# Patient Record
Sex: Male | Born: 1935 | Race: White | Hispanic: No | Marital: Married
Health system: Southern US, Community
[De-identification: ages and names within clinical notes are randomized; demographics above are authoritative.]

---

## 2003-11-30 ENCOUNTER — Inpatient Hospital Stay: Payer: Self-pay | Admitting: Internal Medicine

## 2003-11-30 ENCOUNTER — Other Ambulatory Visit: Payer: Self-pay

## 2005-02-13 ENCOUNTER — Inpatient Hospital Stay: Payer: Self-pay

## 2005-02-13 ENCOUNTER — Other Ambulatory Visit: Payer: Self-pay

## 2005-02-21 ENCOUNTER — Encounter: Payer: Self-pay | Admitting: Internal Medicine

## 2005-02-27 ENCOUNTER — Ambulatory Visit: Payer: Self-pay | Admitting: Internal Medicine

## 2005-03-09 ENCOUNTER — Encounter: Payer: Self-pay | Admitting: Internal Medicine

## 2005-03-13 ENCOUNTER — Ambulatory Visit: Payer: Self-pay | Admitting: Internal Medicine

## 2005-03-20 ENCOUNTER — Encounter: Payer: Self-pay | Admitting: Internal Medicine

## 2005-03-29 ENCOUNTER — Other Ambulatory Visit: Payer: Self-pay

## 2005-03-30 ENCOUNTER — Inpatient Hospital Stay: Payer: Self-pay | Admitting: Internal Medicine

## 2005-04-02 ENCOUNTER — Other Ambulatory Visit: Payer: Self-pay

## 2005-04-05 ENCOUNTER — Encounter: Payer: Self-pay | Admitting: Internal Medicine

## 2005-04-06 ENCOUNTER — Encounter: Payer: Self-pay | Admitting: Internal Medicine

## 2005-04-07 ENCOUNTER — Ambulatory Visit: Payer: Self-pay | Admitting: Internal Medicine

## 2005-05-08 ENCOUNTER — Ambulatory Visit: Payer: Self-pay | Admitting: Internal Medicine

## 2005-05-19 ENCOUNTER — Other Ambulatory Visit: Payer: Self-pay

## 2005-05-26 ENCOUNTER — Inpatient Hospital Stay: Payer: Self-pay | Admitting: Surgery

## 2005-06-06 ENCOUNTER — Ambulatory Visit: Payer: Self-pay | Admitting: Internal Medicine

## 2005-07-07 ENCOUNTER — Ambulatory Visit: Payer: Self-pay | Admitting: Internal Medicine

## 2005-08-06 ENCOUNTER — Ambulatory Visit: Payer: Self-pay | Admitting: Internal Medicine

## 2005-09-06 ENCOUNTER — Ambulatory Visit: Payer: Self-pay | Admitting: Internal Medicine

## 2005-10-11 ENCOUNTER — Ambulatory Visit: Payer: Self-pay | Admitting: Internal Medicine

## 2005-11-06 ENCOUNTER — Ambulatory Visit: Payer: Self-pay | Admitting: Internal Medicine

## 2005-12-07 ENCOUNTER — Ambulatory Visit: Payer: Self-pay | Admitting: Internal Medicine

## 2006-02-16 ENCOUNTER — Ambulatory Visit: Payer: Self-pay | Admitting: Internal Medicine

## 2006-03-09 ENCOUNTER — Ambulatory Visit: Payer: Self-pay | Admitting: Internal Medicine

## 2006-04-07 ENCOUNTER — Ambulatory Visit: Payer: Self-pay | Admitting: Internal Medicine

## 2006-04-17 ENCOUNTER — Encounter: Payer: Self-pay | Admitting: Family Medicine

## 2006-05-11 ENCOUNTER — Ambulatory Visit: Payer: Self-pay | Admitting: Internal Medicine

## 2006-05-15 ENCOUNTER — Encounter: Payer: Self-pay | Admitting: Family Medicine

## 2006-06-07 ENCOUNTER — Ambulatory Visit: Payer: Self-pay | Admitting: Internal Medicine

## 2006-06-07 ENCOUNTER — Encounter: Payer: Self-pay | Admitting: Family Medicine

## 2006-07-08 ENCOUNTER — Ambulatory Visit: Payer: Self-pay | Admitting: Internal Medicine

## 2006-08-07 ENCOUNTER — Ambulatory Visit: Payer: Self-pay | Admitting: Internal Medicine

## 2006-09-14 ENCOUNTER — Ambulatory Visit: Payer: Self-pay | Admitting: Internal Medicine

## 2006-10-08 ENCOUNTER — Ambulatory Visit: Payer: Self-pay | Admitting: Internal Medicine

## 2006-11-07 ENCOUNTER — Ambulatory Visit: Payer: Self-pay | Admitting: Internal Medicine

## 2006-12-08 ENCOUNTER — Ambulatory Visit: Payer: Self-pay | Admitting: Internal Medicine

## 2007-01-07 ENCOUNTER — Ambulatory Visit: Payer: Self-pay | Admitting: Internal Medicine

## 2007-02-01 ENCOUNTER — Ambulatory Visit: Payer: Self-pay | Admitting: Internal Medicine

## 2007-02-07 ENCOUNTER — Ambulatory Visit: Payer: Self-pay | Admitting: Internal Medicine

## 2007-03-10 ENCOUNTER — Ambulatory Visit: Payer: Self-pay | Admitting: Internal Medicine

## 2007-04-07 ENCOUNTER — Ambulatory Visit: Payer: Self-pay | Admitting: Internal Medicine

## 2007-04-09 ENCOUNTER — Ambulatory Visit: Payer: Self-pay | Admitting: Surgery

## 2007-05-08 ENCOUNTER — Ambulatory Visit: Payer: Self-pay | Admitting: Internal Medicine

## 2007-05-24 ENCOUNTER — Ambulatory Visit: Payer: Self-pay | Admitting: Internal Medicine

## 2007-06-07 ENCOUNTER — Ambulatory Visit: Payer: Self-pay | Admitting: Internal Medicine

## 2007-07-19 ENCOUNTER — Ambulatory Visit: Payer: Self-pay | Admitting: Internal Medicine

## 2007-08-07 ENCOUNTER — Ambulatory Visit: Payer: Self-pay | Admitting: Internal Medicine

## 2007-09-13 ENCOUNTER — Ambulatory Visit: Payer: Self-pay | Admitting: Internal Medicine

## 2007-10-08 ENCOUNTER — Ambulatory Visit: Payer: Self-pay | Admitting: Internal Medicine

## 2007-11-08 ENCOUNTER — Ambulatory Visit: Payer: Self-pay | Admitting: Internal Medicine

## 2007-12-08 ENCOUNTER — Ambulatory Visit: Payer: Self-pay | Admitting: Internal Medicine

## 2008-01-07 ENCOUNTER — Ambulatory Visit: Payer: Self-pay | Admitting: Internal Medicine

## 2008-02-07 ENCOUNTER — Ambulatory Visit: Payer: Self-pay | Admitting: Internal Medicine

## 2008-02-28 ENCOUNTER — Ambulatory Visit: Payer: Self-pay | Admitting: Internal Medicine

## 2008-03-09 ENCOUNTER — Ambulatory Visit: Payer: Self-pay | Admitting: Internal Medicine

## 2008-04-06 ENCOUNTER — Ambulatory Visit: Payer: Self-pay | Admitting: Internal Medicine

## 2008-04-24 ENCOUNTER — Ambulatory Visit: Payer: Self-pay | Admitting: Internal Medicine

## 2008-05-07 ENCOUNTER — Ambulatory Visit: Payer: Self-pay | Admitting: Internal Medicine

## 2008-06-19 ENCOUNTER — Ambulatory Visit: Payer: Self-pay | Admitting: Surgery

## 2008-08-14 ENCOUNTER — Ambulatory Visit: Payer: Self-pay | Admitting: Internal Medicine

## 2008-09-06 ENCOUNTER — Ambulatory Visit: Payer: Self-pay | Admitting: Internal Medicine

## 2008-10-09 ENCOUNTER — Ambulatory Visit: Payer: Self-pay | Admitting: Internal Medicine

## 2008-11-06 ENCOUNTER — Ambulatory Visit: Payer: Self-pay | Admitting: Internal Medicine

## 2008-12-07 ENCOUNTER — Ambulatory Visit: Payer: Self-pay | Admitting: Internal Medicine

## 2009-01-22 ENCOUNTER — Ambulatory Visit: Payer: Self-pay | Admitting: Internal Medicine

## 2009-02-06 ENCOUNTER — Ambulatory Visit: Payer: Self-pay | Admitting: Internal Medicine

## 2009-03-26 ENCOUNTER — Ambulatory Visit: Payer: Self-pay | Admitting: Internal Medicine

## 2009-04-06 ENCOUNTER — Ambulatory Visit: Payer: Self-pay | Admitting: Internal Medicine

## 2009-05-21 ENCOUNTER — Ambulatory Visit: Payer: Self-pay | Admitting: Internal Medicine

## 2009-06-06 ENCOUNTER — Ambulatory Visit: Payer: Self-pay | Admitting: Internal Medicine

## 2009-07-07 ENCOUNTER — Ambulatory Visit: Payer: Self-pay | Admitting: Internal Medicine

## 2009-07-16 ENCOUNTER — Ambulatory Visit: Payer: Self-pay | Admitting: Internal Medicine

## 2009-08-06 ENCOUNTER — Ambulatory Visit: Payer: Self-pay | Admitting: Internal Medicine

## 2009-08-20 ENCOUNTER — Ambulatory Visit: Payer: Self-pay | Admitting: Surgery

## 2009-09-10 ENCOUNTER — Ambulatory Visit: Payer: Self-pay | Admitting: Internal Medicine

## 2009-10-07 ENCOUNTER — Ambulatory Visit: Payer: Self-pay | Admitting: Internal Medicine

## 2009-11-06 ENCOUNTER — Ambulatory Visit: Payer: Self-pay | Admitting: Internal Medicine

## 2009-12-31 ENCOUNTER — Ambulatory Visit: Payer: Self-pay | Admitting: Internal Medicine

## 2010-01-06 ENCOUNTER — Ambulatory Visit: Payer: Self-pay | Admitting: Internal Medicine

## 2010-02-25 ENCOUNTER — Ambulatory Visit: Payer: Self-pay | Admitting: Internal Medicine

## 2010-03-09 ENCOUNTER — Ambulatory Visit: Payer: Self-pay | Admitting: Internal Medicine

## 2010-04-22 ENCOUNTER — Ambulatory Visit: Payer: Self-pay | Admitting: Internal Medicine

## 2010-05-08 ENCOUNTER — Ambulatory Visit: Payer: Self-pay | Admitting: Internal Medicine

## 2010-06-17 ENCOUNTER — Ambulatory Visit: Payer: Self-pay | Admitting: Internal Medicine

## 2010-07-08 ENCOUNTER — Ambulatory Visit: Payer: Self-pay | Admitting: Internal Medicine

## 2010-08-12 ENCOUNTER — Ambulatory Visit: Payer: Self-pay | Admitting: Internal Medicine

## 2010-09-07 ENCOUNTER — Ambulatory Visit: Payer: Self-pay | Admitting: Internal Medicine

## 2010-10-12 ENCOUNTER — Ambulatory Visit: Payer: Self-pay | Admitting: Internal Medicine

## 2010-11-07 ENCOUNTER — Ambulatory Visit: Payer: Self-pay | Admitting: Internal Medicine

## 2010-12-08 ENCOUNTER — Ambulatory Visit: Payer: Self-pay | Admitting: Internal Medicine

## 2011-01-07 ENCOUNTER — Ambulatory Visit: Payer: Self-pay | Admitting: Internal Medicine

## 2011-02-07 ENCOUNTER — Ambulatory Visit: Payer: Self-pay | Admitting: Internal Medicine

## 2011-03-03 LAB — CBC CANCER CENTER
Basophil #: 0.1 x10 3/mm (ref 0.0–0.1)
Basophil %: 1.4 %
Eosinophil #: 0.1 x10 3/mm (ref 0.0–0.7)
Eosinophil %: 3.4 %
HCT: 36.6 % — ABNORMAL LOW (ref 40.0–52.0)
Lymphocyte #: 0.6 x10 3/mm — ABNORMAL LOW (ref 1.0–3.6)
Lymphocyte %: 17.1 %
MCH: 45.6 pg — ABNORMAL HIGH (ref 26.0–34.0)
MCHC: 35 g/dL (ref 32.0–36.0)
MCV: 130 fL — ABNORMAL HIGH (ref 80–100)
Neutrophil #: 2.6 x10 3/mm (ref 1.4–6.5)
RDW: 15.4 % — ABNORMAL HIGH (ref 11.5–14.5)
WBC: 3.8 x10 3/mm (ref 3.8–10.6)

## 2011-03-03 LAB — CREATININE, SERUM: EGFR (Non-African Amer.): >60

## 2011-03-03 LAB — HEPATIC FUNCTION PANEL A (ARMC)
Albumin: 3.9 g/dL (ref 3.4–5.0)
Bilirubin, Direct: 0.1 mg/dL (ref 0.00–0.20)
Total Protein: 6.9 g/dL (ref 6.4–8.2)

## 2011-03-10 ENCOUNTER — Ambulatory Visit: Payer: Self-pay | Admitting: Internal Medicine

## 2011-03-31 LAB — CBC CANCER CENTER
Basophil %: 1.8 %
Eosinophil #: 0.1 x10 3/mm (ref 0.0–0.7)
HCT: 38.4 % — ABNORMAL LOW (ref 40.0–52.0)
HGB: 13.5 g/dL (ref 13.0–18.0)
Lymphocyte %: 19.3 %
MCH: 45.6 pg — ABNORMAL HIGH (ref 26.0–34.0)
Monocyte %: 10.4 %
Neutrophil %: 65.2 %
Platelet: 274 x10 3/mm (ref 150–440)
RBC: 2.95 10*6/uL — ABNORMAL LOW (ref 4.40–5.90)
WBC: 4 x10 3/mm (ref 3.8–10.6)

## 2011-03-31 LAB — HEPATIC FUNCTION PANEL A (ARMC)
Albumin: 4.1 g/dL (ref 3.4–5.0)
Alkaline Phosphatase: 58 U/L (ref 50–136)
SGOT(AST): 17 U/L (ref 15–37)

## 2011-03-31 LAB — CREATININE, SERUM
Creatinine: 1.17 mg/dL (ref 0.60–1.30)
EGFR (African American): >60
EGFR (Non-African Amer.): >60

## 2011-04-07 ENCOUNTER — Ambulatory Visit: Payer: Self-pay | Admitting: Internal Medicine

## 2011-05-12 ENCOUNTER — Ambulatory Visit: Payer: Self-pay | Admitting: Internal Medicine

## 2011-05-12 LAB — CBC CANCER CENTER
Basophil #: 0 x10 3/mm (ref 0.0–0.1)
Eosinophil #: 0.1 x10 3/mm (ref 0.0–0.7)
Eosinophil %: 2.1 %
HCT: 39 % — ABNORMAL LOW (ref 40.0–52.0)
HGB: 13.7 g/dL (ref 13.0–18.0)
MCHC: 35.2 g/dL (ref 32.0–36.0)
MCV: 129 fL — ABNORMAL HIGH (ref 80–100)
Monocyte %: 10.3 %
Neutrophil #: 2.7 x10 3/mm (ref 1.4–6.5)
Neutrophil %: 68.8 %
Platelet: 265 x10 3/mm (ref 150–440)
WBC: 3.9 x10 3/mm (ref 3.8–10.6)

## 2011-06-07 ENCOUNTER — Ambulatory Visit: Payer: Self-pay | Admitting: Internal Medicine

## 2011-06-23 LAB — CBC CANCER CENTER
Basophil #: 0 x10 3/mm (ref 0.0–0.1)
Basophil %: 1.4 %
Eosinophil #: 0.1 x10 3/mm (ref 0.0–0.7)
HCT: 36.9 % — ABNORMAL LOW (ref 40.0–52.0)
HGB: 12.6 g/dL — ABNORMAL LOW (ref 13.0–18.0)
Lymphocyte #: 0.6 x10 3/mm — ABNORMAL LOW (ref 1.0–3.6)
MCH: 45.7 pg — ABNORMAL HIGH (ref 26.0–34.0)
MCHC: 34.2 g/dL (ref 32.0–36.0)
Monocyte #: 0.3 x10 3/mm (ref 0.2–1.0)
Monocyte %: 8.7 %
Neutrophil %: 70.3 %
Platelet: 209 x10 3/mm (ref 150–440)
RBC: 2.76 10*6/uL — ABNORMAL LOW (ref 4.40–5.90)
RDW: 14.2 % (ref 11.5–14.5)

## 2011-06-23 LAB — CREATININE, SERUM
Creatinine: 1.24 mg/dL (ref 0.60–1.30)
EGFR (African American): >60
EGFR (Non-African Amer.): 57 — ABNORMAL LOW

## 2011-06-23 LAB — HEPATIC FUNCTION PANEL A (ARMC)
Bilirubin,Total: 0.7 mg/dL (ref 0.2–1.0)
SGOT(AST): 16 U/L (ref 15–37)
Total Protein: 7 g/dL (ref 6.4–8.2)

## 2011-07-08 ENCOUNTER — Ambulatory Visit: Payer: Self-pay | Admitting: Internal Medicine

## 2011-08-04 LAB — CBC CANCER CENTER
Basophil %: 1.7 %
Eosinophil #: 0.1 x10 3/mm (ref 0.0–0.7)
Eosinophil %: 1.9 %
HCT: 38.1 % — ABNORMAL LOW (ref 40.0–52.0)
Lymphocyte %: 12.4 %
MCHC: 34.3 g/dL (ref 32.0–36.0)
Monocyte %: 8.6 %
Neutrophil #: 3.3 x10 3/mm (ref 1.4–6.5)
Platelet: 318 x10 3/mm (ref 150–440)
WBC: 4.4 x10 3/mm (ref 3.8–10.6)

## 2011-08-07 ENCOUNTER — Ambulatory Visit: Payer: Self-pay | Admitting: Internal Medicine

## 2011-08-18 ENCOUNTER — Ambulatory Visit: Payer: Self-pay | Admitting: Ophthalmology

## 2011-08-18 LAB — CBC WITH DIFFERENTIAL/PLATELET
Basophil #: 0.1 10*3/uL (ref 0.0–0.1)
Basophil %: 1.1 %
Eosinophil #: 0.1 10*3/uL (ref 0.0–0.7)
Eosinophil %: 1.3 %
HCT: 42.3 % (ref 40.0–52.0)
HGB: 14.4 g/dL (ref 13.0–18.0)
Lymphocyte %: 12.5 %
MCHC: 34.1 g/dL (ref 32.0–36.0)
Monocyte #: 0.6 x10 3/mm (ref 0.2–1.0)
Monocyte %: 9.1 %
Neutrophil %: 76 %
RBC: 3.3 10*6/uL — ABNORMAL LOW (ref 4.40–5.90)
WBC: 6.6 10*3/uL (ref 3.8–10.6)

## 2011-08-28 ENCOUNTER — Ambulatory Visit: Payer: Self-pay | Admitting: Ophthalmology

## 2011-09-15 ENCOUNTER — Ambulatory Visit: Payer: Self-pay | Admitting: Internal Medicine

## 2011-09-15 LAB — CBC CANCER CENTER
Basophil %: 1.2 %
Eosinophil %: 1.2 %
HGB: 13.5 g/dL (ref 13.0–18.0)
Lymphocyte %: 7.9 %
MCH: 43.4 pg — ABNORMAL HIGH (ref 26.0–34.0)
Monocyte #: 0.8 x10 3/mm (ref 0.2–1.0)
Monocyte %: 12.6 %
Neutrophil %: 77.1 %
Platelet: 350 x10 3/mm (ref 150–440)
RBC: 3.12 10*6/uL — ABNORMAL LOW (ref 4.40–5.90)
WBC: 6.2 x10 3/mm (ref 3.8–10.6)

## 2011-09-15 LAB — CREATININE, SERUM
EGFR (African American): >60
EGFR (Non-African Amer.): 55 — ABNORMAL LOW

## 2011-09-15 LAB — HEPATIC FUNCTION PANEL A (ARMC)
Albumin: 3.5 g/dL (ref 3.4–5.0)
Alkaline Phosphatase: 66 U/L (ref 50–136)
SGOT(AST): 13 U/L — ABNORMAL LOW (ref 15–37)
SGPT (ALT): 14 U/L (ref 12–78)

## 2011-09-17 LAB — CBC
HCT: 39.1 % — ABNORMAL LOW (ref 40.0–52.0)
MCH: 43.1 pg — ABNORMAL HIGH (ref 26.0–34.0)
MCV: 123 fL — ABNORMAL HIGH (ref 80–100)
Platelet: 423 10*3/uL (ref 150–440)
RBC: 3.19 10*6/uL — ABNORMAL LOW (ref 4.40–5.90)
WBC: 9.9 10*3/uL (ref 3.8–10.6)

## 2011-09-17 LAB — BASIC METABOLIC PANEL
Anion Gap: 7 (ref 7–16)
Calcium, Total: 9 mg/dL (ref 8.5–10.1)
Chloride: 108 mmol/L — ABNORMAL HIGH (ref 98–107)
Creatinine: 1.4 mg/dL — ABNORMAL HIGH (ref 0.60–1.30)
EGFR (African American): 57 — ABNORMAL LOW
EGFR (Non-African Amer.): 49 — ABNORMAL LOW
Glucose: 116 mg/dL — ABNORMAL HIGH (ref 65–99)
Potassium: 4.7 mmol/L (ref 3.5–5.1)
Sodium: 142 mmol/L (ref 136–145)

## 2011-09-17 LAB — CK TOTAL AND CKMB (NOT AT ARMC)
CK, Total: 44 U/L (ref 35–232)
CK-MB: <0.5 ng/mL — ABNORMAL LOW (ref 0.5–3.6)

## 2011-09-17 LAB — TROPONIN I: Troponin-I: <0.02 ng/mL

## 2011-09-18 ENCOUNTER — Inpatient Hospital Stay: Payer: Self-pay | Admitting: Internal Medicine

## 2011-09-18 LAB — URINALYSIS, COMPLETE
Bilirubin,UR: NEGATIVE
Blood: NEGATIVE
Glucose,UR: NEGATIVE mg/dL (ref 0–75)
Leukocyte Esterase: NEGATIVE
Nitrite: NEGATIVE
Ph: 5 (ref 4.5–8.0)
Protein: NEGATIVE
RBC,UR: <1 /HPF (ref 0–5)
Specific Gravity: 1.015 (ref 1.003–1.030)
Squamous Epithelial: NONE SEEN

## 2011-09-18 LAB — APTT
Activated PTT: 34.5 secs (ref 23.6–35.9)
Activated PTT: 64.1 secs — ABNORMAL HIGH (ref 23.6–35.9)
Activated PTT: 82 secs — ABNORMAL HIGH (ref 23.6–35.9)

## 2011-09-19 LAB — CBC WITH DIFFERENTIAL/PLATELET
Basophil %: 1.1 %
Eosinophil %: 2.8 %
HGB: 11.9 g/dL — ABNORMAL LOW (ref 13.0–18.0)
Lymphocyte #: 0.7 10*3/uL — ABNORMAL LOW (ref 1.0–3.6)
Lymphocyte %: 9.7 %
MCH: 42.2 pg — ABNORMAL HIGH (ref 26.0–34.0)
Monocyte #: 0.9 x10 3/mm (ref 0.2–1.0)
Neutrophil %: 74.1 %
Platelet: 393 10*3/uL (ref 150–440)
RBC: 2.83 10*6/uL — ABNORMAL LOW (ref 4.40–5.90)
RDW: 12.6 % (ref 11.5–14.5)
WBC: 7.5 10*3/uL (ref 3.8–10.6)

## 2011-09-19 LAB — BASIC METABOLIC PANEL
Calcium, Total: 8 mg/dL — ABNORMAL LOW (ref 8.5–10.1)
Chloride: 109 mmol/L — ABNORMAL HIGH (ref 98–107)
Co2: 25 mmol/L (ref 21–32)
Creatinine: 0.97 mg/dL (ref 0.60–1.30)
EGFR (African American): >60
Osmolality: 282 (ref 275–301)
Potassium: 3.8 mmol/L (ref 3.5–5.1)
Sodium: 142 mmol/L (ref 136–145)

## 2011-09-20 LAB — PROTIME-INR
INR: 1.1
Prothrombin Time: 14.4 secs (ref 11.5–14.7)

## 2011-09-22 LAB — APTT
Activated PTT: 134.2 secs — ABNORMAL HIGH (ref 23.6–35.9)
Activated PTT: 87.9 secs — ABNORMAL HIGH (ref 23.6–35.9)

## 2011-09-22 LAB — PROTIME-INR
INR: 1.9
Prothrombin Time: 21.7 secs — ABNORMAL HIGH (ref 11.5–14.7)

## 2011-09-23 LAB — APTT: Activated PTT: 126.6 secs — ABNORMAL HIGH (ref 23.6–35.9)

## 2011-09-23 LAB — PROTIME-INR
INR: 1.7
Prothrombin Time: 20.6 secs — ABNORMAL HIGH (ref 11.5–14.7)

## 2011-09-24 LAB — PLATELET COUNT: Platelet: 516 10*3/uL — ABNORMAL HIGH (ref 150–440)

## 2011-09-24 LAB — HEMOGLOBIN: HGB: 11.6 g/dL — ABNORMAL LOW (ref 13.0–18.0)

## 2011-09-25 ENCOUNTER — Ambulatory Visit: Payer: Self-pay | Admitting: Internal Medicine

## 2011-10-04 ENCOUNTER — Inpatient Hospital Stay: Payer: Self-pay | Admitting: Specialist

## 2011-10-04 LAB — CBC WITH DIFFERENTIAL/PLATELET
Basophil %: 0.8 %
Eosinophil #: 0.1 10*3/uL (ref 0.0–0.7)
Eosinophil %: 0.7 %
HCT: 36.8 % — ABNORMAL LOW (ref 40.0–52.0)
MCH: 39.5 pg — ABNORMAL HIGH (ref 26.0–34.0)
MCHC: 33.6 g/dL (ref 32.0–36.0)
Monocyte #: 1 x10 3/mm (ref 0.2–1.0)
Neutrophil %: 83.8 %
Platelet: 387 10*3/uL (ref 150–440)
RBC: 3.13 10*6/uL — ABNORMAL LOW (ref 4.40–5.90)

## 2011-10-04 LAB — COMPREHENSIVE METABOLIC PANEL
Albumin: 3.4 g/dL (ref 3.4–5.0)
Alkaline Phosphatase: 58 U/L (ref 50–136)
Anion Gap: 8 (ref 7–16)
Bilirubin,Total: 0.4 mg/dL (ref 0.2–1.0)
Co2: 25 mmol/L (ref 21–32)
Creatinine: 1.17 mg/dL (ref 0.60–1.30)
EGFR (Non-African Amer.): >60
Glucose: 99 mg/dL (ref 65–99)
Osmolality: 279 (ref 275–301)
Potassium: 3.8 mmol/L (ref 3.5–5.1)
Sodium: 140 mmol/L (ref 136–145)
Total Protein: 6.7 g/dL (ref 6.4–8.2)

## 2011-10-04 LAB — URINALYSIS, COMPLETE
Blood: NEGATIVE
Glucose,UR: NEGATIVE mg/dL (ref 0–75)
Hyaline Cast: 3
Leukocyte Esterase: NEGATIVE
Nitrite: NEGATIVE
Ph: 6 (ref 4.5–8.0)
Protein: NEGATIVE
Specific Gravity: 1.015 (ref 1.003–1.030)
WBC UR: <1 /HPF (ref 0–5)

## 2011-10-04 LAB — APTT: Activated PTT: 33.1 secs (ref 23.6–35.9)

## 2011-10-04 LAB — TROPONIN I: Troponin-I: <0.02 ng/mL

## 2011-10-04 LAB — PROTIME-INR: INR: 1.5

## 2011-10-05 ENCOUNTER — Ambulatory Visit: Payer: Self-pay | Admitting: Neurology

## 2011-10-05 DIAGNOSIS — I059 Rheumatic mitral valve disease, unspecified: Secondary | ICD-10-CM

## 2011-10-05 LAB — CBC WITH DIFFERENTIAL/PLATELET
Eosinophil %: 2.5 %
HCT: 34.3 % — ABNORMAL LOW (ref 40.0–52.0)
HGB: 11.5 g/dL — ABNORMAL LOW (ref 13.0–18.0)
Lymphocyte %: 12.3 %
MCH: 39.2 pg — ABNORMAL HIGH (ref 26.0–34.0)
Monocyte %: 9.8 %
Neutrophil %: 73.1 %
Platelet: 340 10*3/uL (ref 150–440)
RBC: 2.92 10*6/uL — ABNORMAL LOW (ref 4.40–5.90)
RDW: 13.5 % (ref 11.5–14.5)
WBC: 6.3 10*3/uL (ref 3.8–10.6)

## 2011-10-05 LAB — LIPID PANEL
Cholesterol: 120 mg/dL (ref 0–200)
Ldl Cholesterol, Calc: 77 mg/dL (ref 0–100)
Triglycerides: 76 mg/dL (ref 0–200)

## 2011-10-05 LAB — BASIC METABOLIC PANEL
Calcium, Total: 8.5 mg/dL (ref 8.5–10.1)
Co2: 23 mmol/L (ref 21–32)
EGFR (African American): >60
Glucose: 98 mg/dL (ref 65–99)
Osmolality: 282 (ref 275–301)
Potassium: 4.1 mmol/L (ref 3.5–5.1)
Sodium: 142 mmol/L (ref 136–145)

## 2011-10-06 ENCOUNTER — Ambulatory Visit: Payer: Self-pay | Admitting: Neurology

## 2011-10-06 LAB — PROTIME-INR
INR: 2.5
Prothrombin Time: 27.5 secs — ABNORMAL HIGH (ref 11.5–14.7)

## 2011-10-07 LAB — PROTIME-INR
INR: 2.2
Prothrombin Time: 24.9 secs — ABNORMAL HIGH (ref 11.5–14.7)

## 2011-10-08 ENCOUNTER — Ambulatory Visit: Payer: Self-pay | Admitting: Internal Medicine

## 2011-10-09 LAB — PROTIME-INR: Prothrombin Time: 25.7 secs — ABNORMAL HIGH (ref 11.5–14.7)

## 2011-10-10 LAB — HEMOGLOBIN: HGB: 11.7 g/dL — ABNORMAL LOW (ref 13.0–18.0)

## 2011-10-10 LAB — PROTIME-INR: INR: 2.4

## 2011-10-27 ENCOUNTER — Ambulatory Visit: Payer: Self-pay | Admitting: Internal Medicine

## 2011-10-27 LAB — CBC CANCER CENTER
Basophil #: 0.1 x10 3/mm (ref 0.0–0.1)
Eosinophil #: 0.1 x10 3/mm (ref 0.0–0.7)
Eosinophil %: 1.1 %
HCT: 38.9 % — ABNORMAL LOW (ref 40.0–52.0)
HGB: 12.8 g/dL — ABNORMAL LOW (ref 13.0–18.0)
Lymphocyte #: 0.5 x10 3/mm — ABNORMAL LOW (ref 1.0–3.6)
Lymphocyte %: 6.1 %
MCHC: 32.8 g/dL (ref 32.0–36.0)
Monocyte %: 9.6 %
Neutrophil #: 6.4 x10 3/mm (ref 1.4–6.5)
Neutrophil %: 81.8 %
RDW: 14.3 % (ref 11.5–14.5)
WBC: 7.8 x10 3/mm (ref 3.8–10.6)

## 2011-11-03 ENCOUNTER — Ambulatory Visit: Payer: Self-pay | Admitting: Vascular Surgery

## 2011-11-07 ENCOUNTER — Ambulatory Visit: Payer: Self-pay | Admitting: Internal Medicine

## 2011-12-08 ENCOUNTER — Ambulatory Visit: Payer: Self-pay | Admitting: Internal Medicine

## 2011-12-08 LAB — HEPATIC FUNCTION PANEL A (ARMC)
Albumin: 3.4 g/dL (ref 3.4–5.0)
Bilirubin, Direct: 0.1 mg/dL (ref 0.00–0.20)
SGOT(AST): 18 U/L (ref 15–37)
SGPT (ALT): 26 U/L (ref 12–78)
Total Protein: 6.7 g/dL (ref 6.4–8.2)

## 2011-12-08 LAB — CBC CANCER CENTER
Basophil #: 0.2 x10 3/mm — ABNORMAL HIGH (ref 0.0–0.1)
Eosinophil %: 2.9 %
HGB: 10.8 g/dL — ABNORMAL LOW (ref 13.0–18.0)
Lymphocyte #: 0.7 x10 3/mm — ABNORMAL LOW (ref 1.0–3.6)
Lymphocyte %: 10 %
MCH: 31.2 pg (ref 26.0–34.0)
MCHC: 30.8 g/dL — ABNORMAL LOW (ref 32.0–36.0)
MCV: 101 fL — ABNORMAL HIGH (ref 80–100)
Monocyte #: 0.5 x10 3/mm (ref 0.2–1.0)
Neutrophil #: 5.1 x10 3/mm (ref 1.4–6.5)
Neutrophil %: 77.2 %
Platelet: 455 x10 3/mm — ABNORMAL HIGH (ref 150–440)
RDW: 15.9 % — ABNORMAL HIGH (ref 11.5–14.5)

## 2011-12-08 LAB — PROTIME-INR
INR: 2
Prothrombin Time: 23.3 secs — ABNORMAL HIGH (ref 11.5–14.7)

## 2011-12-08 LAB — CREATININE, SERUM: EGFR (African American): >60

## 2012-01-07 ENCOUNTER — Ambulatory Visit: Payer: Self-pay | Admitting: Internal Medicine

## 2012-01-19 LAB — CBC CANCER CENTER
Basophil %: 2.2 %
Eosinophil #: 0.3 x10 3/mm (ref 0.0–0.7)
Eosinophil %: 3.1 %
HCT: 38.4 % — ABNORMAL LOW (ref 40.0–52.0)
HGB: 12.3 g/dL — ABNORMAL LOW (ref 13.0–18.0)
Lymphocyte #: 0.7 x10 3/mm — ABNORMAL LOW (ref 1.0–3.6)
MCH: 29 pg (ref 26.0–34.0)
MCHC: 32.1 g/dL (ref 32.0–36.0)
MCV: 90 fL (ref 80–100)
Monocyte #: 0.8 x10 3/mm (ref 0.2–1.0)
Neutrophil #: 6.7 x10 3/mm — ABNORMAL HIGH (ref 1.4–6.5)
Neutrophil %: 77.3 %
RBC: 4.25 10*6/uL — ABNORMAL LOW (ref 4.40–5.90)
WBC: 8.7 x10 3/mm (ref 3.8–10.6)

## 2012-02-07 ENCOUNTER — Ambulatory Visit: Payer: Self-pay | Admitting: Internal Medicine

## 2012-03-01 LAB — HEPATIC FUNCTION PANEL A (ARMC)
Alkaline Phosphatase: 60 U/L (ref 50–136)
Bilirubin, Direct: 0.1 mg/dL (ref 0.00–0.20)
Bilirubin,Total: 0.4 mg/dL (ref 0.2–1.0)
SGOT(AST): 16 U/L (ref 15–37)
SGPT (ALT): 15 U/L (ref 12–78)
Total Protein: 6.9 g/dL (ref 6.4–8.2)

## 2012-03-01 LAB — CBC CANCER CENTER
Basophil #: 0.1 x10 3/mm (ref 0.0–0.1)
Basophil %: 1.4 %
Eosinophil %: 2.1 %
MCH: 26.8 pg (ref 26.0–34.0)
MCHC: 31.3 g/dL — ABNORMAL LOW (ref 32.0–36.0)
MCV: 86 fL (ref 80–100)
Monocyte #: 0.6 x10 3/mm (ref 0.2–1.0)
Monocyte %: 7 %
Neutrophil #: 7.5 x10 3/mm — ABNORMAL HIGH (ref 1.4–6.5)
RBC: 4.54 10*6/uL (ref 4.40–5.90)
RDW: 16.4 % — ABNORMAL HIGH (ref 11.5–14.5)
WBC: 9.1 x10 3/mm (ref 3.8–10.6)

## 2012-03-01 LAB — CREATININE, SERUM
Creatinine: 1.1 mg/dL (ref 0.60–1.30)
EGFR (African American): >60
EGFR (Non-African Amer.): >60

## 2012-03-01 LAB — FERRITIN: Ferritin (ARMC): 9 ng/mL (ref 8–388)

## 2012-03-09 ENCOUNTER — Ambulatory Visit: Payer: Self-pay | Admitting: Internal Medicine

## 2012-04-12 ENCOUNTER — Ambulatory Visit: Payer: Self-pay | Admitting: Internal Medicine

## 2012-04-19 LAB — CBC CANCER CENTER
Basophil %: 2.3 %
Eosinophil %: 1.6 %
HGB: 12.3 g/dL — ABNORMAL LOW (ref 13.0–18.0)
Lymphocyte %: 11.9 %
MCH: 31.3 pg (ref 26.0–34.0)
MCHC: 32.5 g/dL (ref 32.0–36.0)
MCV: 96 fL (ref 80–100)
Monocyte #: 0.3 x10 3/mm (ref 0.2–1.0)
Monocyte %: 7.2 %
Neutrophil #: 2.8 x10 3/mm (ref 1.4–6.5)
Neutrophil %: 77 %
Platelet: 316 x10 3/mm (ref 150–440)
WBC: 3.6 x10 3/mm — ABNORMAL LOW (ref 3.8–10.6)

## 2012-05-07 ENCOUNTER — Ambulatory Visit: Payer: Self-pay | Admitting: Internal Medicine

## 2012-05-27 LAB — HEPATIC FUNCTION PANEL A (ARMC)
Albumin: 3.5 g/dL (ref 3.4–5.0)
SGOT(AST): 14 U/L — ABNORMAL LOW (ref 15–37)
Total Protein: 6.6 g/dL (ref 6.4–8.2)

## 2012-05-27 LAB — CREATININE, SERUM
Creatinine: 1.17 mg/dL (ref 0.60–1.30)
EGFR (African American): >60
EGFR (Non-African Amer.): >60

## 2012-05-27 LAB — CBC CANCER CENTER
Basophil #: 0 x10 3/mm (ref 0.0–0.1)
Basophil %: 1.3 %
Eosinophil %: 0.9 %
HGB: 12.6 g/dL — ABNORMAL LOW (ref 13.0–18.0)
Lymphocyte %: 13.7 %
Monocyte #: 0.5 x10 3/mm (ref 0.2–1.0)
Neutrophil #: 2.6 x10 3/mm (ref 1.4–6.5)
Neutrophil %: 70.3 %
Platelet: 268 x10 3/mm (ref 150–440)
RDW: 31.5 % — ABNORMAL HIGH (ref 11.5–14.5)

## 2012-06-06 ENCOUNTER — Ambulatory Visit: Payer: Self-pay | Admitting: Internal Medicine

## 2012-07-05 LAB — CBC CANCER CENTER
Eosinophil #: 0 x10 3/mm (ref 0.0–0.7)
HCT: 37.2 % — ABNORMAL LOW (ref 40.0–52.0)
HGB: 13.1 g/dL (ref 13.0–18.0)
Lymphocyte %: 10.5 %
MCH: 42.9 pg — ABNORMAL HIGH (ref 26.0–34.0)
MCHC: 35.1 g/dL (ref 32.0–36.0)
MCV: 122 fL — ABNORMAL HIGH (ref 80–100)
Monocyte #: 0.5 x10 3/mm (ref 0.2–1.0)
Monocyte %: 11.6 %
Platelet: 285 x10 3/mm (ref 150–440)
RBC: 3.04 10*6/uL — ABNORMAL LOW (ref 4.40–5.90)
RDW: 19.5 % — ABNORMAL HIGH (ref 11.5–14.5)
WBC: 4.4 x10 3/mm (ref 3.8–10.6)

## 2012-07-07 ENCOUNTER — Ambulatory Visit: Payer: Self-pay | Admitting: Internal Medicine

## 2012-08-06 ENCOUNTER — Ambulatory Visit: Payer: Self-pay | Admitting: Internal Medicine

## 2012-08-16 LAB — CREATININE, SERUM
EGFR (African American): 58 — ABNORMAL LOW
EGFR (Non-African Amer.): 50 — ABNORMAL LOW

## 2012-08-16 LAB — CBC CANCER CENTER
Basophil %: 0.9 %
Eosinophil #: 0 x10 3/mm (ref 0.0–0.7)
Eosinophil %: 0.5 %
HCT: 34.8 % — ABNORMAL LOW (ref 40.0–52.0)
HGB: 12.7 g/dL — ABNORMAL LOW (ref 13.0–18.0)
Lymphocyte %: 10.9 %
MCH: 48.3 pg — ABNORMAL HIGH (ref 26.0–34.0)
MCV: 132 fL — ABNORMAL HIGH (ref 80–100)
Neutrophil #: 3.2 x10 3/mm (ref 1.4–6.5)
Neutrophil %: 78.9 %
RBC: 2.63 10*6/uL — ABNORMAL LOW (ref 4.40–5.90)
RDW: 13.6 % (ref 11.5–14.5)

## 2012-08-16 LAB — HEPATIC FUNCTION PANEL A (ARMC)
Bilirubin,Total: 0.5 mg/dL (ref 0.2–1.0)
SGPT (ALT): 15 U/L (ref 12–78)
Total Protein: 6.6 g/dL (ref 6.4–8.2)

## 2012-08-16 LAB — FERRITIN: Ferritin (ARMC): 60 ng/mL (ref 8–388)

## 2012-09-06 ENCOUNTER — Ambulatory Visit: Payer: Self-pay | Admitting: Internal Medicine

## 2012-09-27 LAB — CBC CANCER CENTER
Basophil %: 1.2 %
Eosinophil #: 0 x10 3/mm (ref 0.0–0.7)
Eosinophil %: 0.7 %
Lymphocyte #: 0.5 x10 3/mm — ABNORMAL LOW (ref 1.0–3.6)
MCHC: 36.6 g/dL — ABNORMAL HIGH (ref 32.0–36.0)
Monocyte #: 0.4 x10 3/mm (ref 0.2–1.0)
Neutrophil #: 2.9 x10 3/mm (ref 1.4–6.5)
Neutrophil %: 74.9 %
Platelet: 265 x10 3/mm (ref 150–440)
RBC: 2.58 10*6/uL — ABNORMAL LOW (ref 4.40–5.90)
RDW: 14.6 % — ABNORMAL HIGH (ref 11.5–14.5)
WBC: 3.9 x10 3/mm (ref 3.8–10.6)

## 2012-10-07 ENCOUNTER — Ambulatory Visit: Payer: Self-pay | Admitting: Internal Medicine

## 2012-11-08 ENCOUNTER — Ambulatory Visit: Payer: Self-pay | Admitting: Internal Medicine

## 2012-11-08 LAB — CBC CANCER CENTER
Eosinophil #: 0 x10 3/mm (ref 0.0–0.7)
HGB: 12.6 g/dL — ABNORMAL LOW (ref 13.0–18.0)
MCH: 49.4 pg — ABNORMAL HIGH (ref 26.0–34.0)
MCV: 138 fL — ABNORMAL HIGH (ref 80–100)
Monocyte #: 0.4 x10 3/mm (ref 0.2–1.0)
Monocyte %: 11.3 %
Neutrophil #: 2.5 x10 3/mm (ref 1.4–6.5)
Neutrophil %: 75.2 %
RDW: 13.5 % (ref 11.5–14.5)
WBC: 3.3 x10 3/mm — ABNORMAL LOW (ref 3.8–10.6)

## 2012-11-08 LAB — HEPATIC FUNCTION PANEL A (ARMC)
Albumin: 3.8 g/dL (ref 3.4–5.0)
Alkaline Phosphatase: 55 U/L (ref 50–136)
SGOT(AST): 13 U/L — ABNORMAL LOW (ref 15–37)
Total Protein: 6.7 g/dL (ref 6.4–8.2)

## 2012-12-07 ENCOUNTER — Ambulatory Visit: Payer: Self-pay | Admitting: Internal Medicine

## 2012-12-20 LAB — CBC CANCER CENTER
Basophil #: 0.1 x10 3/mm (ref 0.0–0.1)
Basophil %: 1.4 %
Eosinophil #: 0 x10 3/mm (ref 0.0–0.7)
Eosinophil %: 0.7 %
HCT: 40.2 % (ref 40.0–52.0)
HGB: 14 g/dL (ref 13.0–18.0)
Lymphocyte %: 10.5 %
MCHC: 34.8 g/dL (ref 32.0–36.0)
MCV: 136 fL — ABNORMAL HIGH (ref 80–100)
Monocyte %: 9.9 %
Neutrophil #: 4.1 x10 3/mm (ref 1.4–6.5)
RBC: 2.96 10*6/uL — ABNORMAL LOW (ref 4.40–5.90)
RDW: 12.2 % (ref 11.5–14.5)

## 2013-01-06 ENCOUNTER — Ambulatory Visit: Payer: Self-pay | Admitting: Internal Medicine

## 2013-01-24 LAB — CBC CANCER CENTER
Basophil #: 0 x10 3/mm (ref 0.0–0.1)
Basophil %: 0.8 %
Eosinophil %: 0.9 %
HGB: 14 g/dL (ref 13.0–18.0)
MCV: 129 fL — ABNORMAL HIGH (ref 80–100)
Neutrophil #: 3.9 x10 3/mm (ref 1.4–6.5)
Neutrophil %: 78.3 %
RBC: 3.19 10*6/uL — ABNORMAL LOW (ref 4.40–5.90)
RDW: 12 % (ref 11.5–14.5)
WBC: 4.9 x10 3/mm (ref 3.8–10.6)

## 2013-01-24 LAB — FERRITIN: Ferritin (ARMC): 15 ng/mL (ref 8–388)

## 2013-01-24 LAB — HEPATIC FUNCTION PANEL A (ARMC)
Albumin: 3.7 g/dL (ref 3.4–5.0)
Alkaline Phosphatase: 47 U/L
Bilirubin, Direct: 0.1 mg/dL (ref 0.00–0.20)
Bilirubin,Total: 0.3 mg/dL (ref 0.2–1.0)
Total Protein: 6.9 g/dL (ref 6.4–8.2)

## 2013-02-06 ENCOUNTER — Ambulatory Visit: Payer: Self-pay | Admitting: Family Medicine

## 2013-02-06 ENCOUNTER — Ambulatory Visit: Payer: Self-pay | Admitting: Internal Medicine

## 2013-03-07 LAB — CBC CANCER CENTER
Basophil #: 0.1 "x10 3/mm "
Basophil %: 2.1 %
Eosinophil #: 0.1 "x10 3/mm "
Eosinophil %: 1.5 %
HCT: 41 %
HGB: 13.5 g/dL
Lymphocyte %: 6.2 %
Lymphs Abs: 0.4 "x10 3/mm " — ABNORMAL LOW
MCH: 37.6 pg — ABNORMAL HIGH
MCHC: 32.9 g/dL
MCV: 114 fL — ABNORMAL HIGH
Monocyte #: 0.6 "x10 3/mm "
Monocyte %: 9.1 %
Neutrophil #: 5.1 "x10 3/mm "
Neutrophil %: 81.1 %
Platelet: 415 "x10 3/mm "
RBC: 3.58 "x10 6/mm " — ABNORMAL LOW
RDW: 14.7 % — ABNORMAL HIGH
WBC: 6.3 "x10 3/mm "

## 2013-03-09 ENCOUNTER — Ambulatory Visit: Payer: Self-pay | Admitting: Internal Medicine

## 2013-04-17 ENCOUNTER — Ambulatory Visit: Payer: Self-pay | Admitting: Internal Medicine

## 2013-04-18 LAB — CBC CANCER CENTER
BASOS ABS: 0.1 x10 3/mm (ref 0.0–0.1)
BASOS PCT: 1.1 %
EOS ABS: 0.1 x10 3/mm (ref 0.0–0.7)
Eosinophil %: 1.8 %
HCT: 43.5 % (ref 40.0–52.0)
HGB: 13.9 g/dL (ref 13.0–18.0)
LYMPHS PCT: 6.2 %
Lymphocyte #: 0.4 x10 3/mm — ABNORMAL LOW (ref 1.0–3.6)
MCH: 33.5 pg (ref 26.0–34.0)
MCHC: 32 g/dL (ref 32.0–36.0)
MCV: 105 fL — ABNORMAL HIGH (ref 80–100)
MONOS PCT: 8.4 %
Monocyte #: 0.5 x10 3/mm (ref 0.2–1.0)
NEUTROS ABS: 5.2 x10 3/mm (ref 1.4–6.5)
NEUTROS PCT: 82.5 %
Platelet: 473 x10 3/mm — ABNORMAL HIGH (ref 150–440)
RBC: 4.16 10*6/uL — AB (ref 4.40–5.90)
RDW: 14.9 % — AB (ref 11.5–14.5)
WBC: 6.3 x10 3/mm (ref 3.8–10.6)

## 2013-04-18 LAB — CREATININE, SERUM
Creatinine: 1.14 mg/dL (ref 0.60–1.30)
EGFR (African American): >60

## 2013-04-18 LAB — HEPATIC FUNCTION PANEL A (ARMC)
ALBUMIN: 3.6 g/dL (ref 3.4–5.0)
ALT: 16 U/L (ref 12–78)
Alkaline Phosphatase: 54 U/L
BILIRUBIN DIRECT: 0.1 mg/dL (ref 0.00–0.20)
Bilirubin,Total: 0.5 mg/dL (ref 0.2–1.0)
SGOT(AST): 17 U/L (ref 15–37)
Total Protein: 6.9 g/dL (ref 6.4–8.2)

## 2013-05-07 ENCOUNTER — Ambulatory Visit: Payer: Self-pay | Admitting: Internal Medicine

## 2013-05-30 LAB — CBC CANCER CENTER
Basophil #: 0.1 x10 3/mm (ref 0.0–0.1)
Basophil %: 2.3 %
Eosinophil #: 0.1 x10 3/mm (ref 0.0–0.7)
Eosinophil %: 1.7 %
HCT: 43.3 % (ref 40.0–52.0)
HGB: 13.7 g/dL (ref 13.0–18.0)
LYMPHS PCT: 10.8 %
Lymphocyte #: 0.6 x10 3/mm — ABNORMAL LOW (ref 1.0–3.6)
MCH: 32.6 pg (ref 26.0–34.0)
MCHC: 31.6 g/dL — ABNORMAL LOW (ref 32.0–36.0)
MCV: 103 fL — ABNORMAL HIGH (ref 80–100)
Monocyte #: 0.4 x10 3/mm (ref 0.2–1.0)
Monocyte %: 8.6 %
NEUTROS ABS: 3.9 x10 3/mm (ref 1.4–6.5)
NEUTROS PCT: 76.6 %
Platelet: 448 x10 3/mm — ABNORMAL HIGH (ref 150–440)
RBC: 4.2 10*6/uL — ABNORMAL LOW (ref 4.40–5.90)
RDW: 17.6 % — ABNORMAL HIGH (ref 11.5–14.5)
WBC: 5.1 x10 3/mm (ref 3.8–10.6)

## 2013-06-01 ENCOUNTER — Inpatient Hospital Stay: Payer: Self-pay | Admitting: Internal Medicine

## 2013-06-01 LAB — URINALYSIS, COMPLETE
BACTERIA: NONE SEEN
Bilirubin,UR: NEGATIVE
GLUCOSE, UR: NEGATIVE mg/dL (ref 0–75)
Ketone: NEGATIVE
Nitrite: NEGATIVE
Ph: 6 (ref 4.5–8.0)
Protein: 30
Specific Gravity: 1.015 (ref 1.003–1.030)

## 2013-06-01 LAB — COMPREHENSIVE METABOLIC PANEL
ALBUMIN: 3.7 g/dL (ref 3.4–5.0)
ALK PHOS: 50 U/L
ALT: 15 U/L (ref 12–78)
ANION GAP: 7 (ref 7–16)
BUN: 11 mg/dL (ref 7–18)
Bilirubin,Total: 1 mg/dL (ref 0.2–1.0)
CALCIUM: 8.7 mg/dL (ref 8.5–10.1)
CO2: 28 mmol/L (ref 21–32)
Chloride: 107 mmol/L (ref 98–107)
Creatinine: 1.08 mg/dL (ref 0.60–1.30)
EGFR (African American): >60
EGFR (Non-African Amer.): >60
Glucose: 120 mg/dL — ABNORMAL HIGH (ref 65–99)
Osmolality: 284 (ref 275–301)
Potassium: 3.7 mmol/L (ref 3.5–5.1)
SGOT(AST): 27 U/L (ref 15–37)
Sodium: 142 mmol/L (ref 136–145)
Total Protein: 7 g/dL (ref 6.4–8.2)

## 2013-06-01 LAB — CBC WITH DIFFERENTIAL/PLATELET
Basophil #: 0 10*3/uL (ref 0.0–0.1)
Basophil %: 0.2 %
EOS ABS: 0 10*3/uL (ref 0.0–0.7)
Eosinophil %: 0.1 %
HCT: 44.7 % (ref 40.0–52.0)
HGB: 14.3 g/dL (ref 13.0–18.0)
LYMPHS ABS: 0.4 10*3/uL — AB (ref 1.0–3.6)
Lymphocyte %: 3 %
MCH: 33.4 pg (ref 26.0–34.0)
MCHC: 31.9 g/dL — AB (ref 32.0–36.0)
MCV: 105 fL — ABNORMAL HIGH (ref 80–100)
Monocyte #: 1.3 x10 3/mm — ABNORMAL HIGH (ref 0.2–1.0)
Monocyte %: 9.9 %
Neutrophil #: 11.7 10*3/uL — ABNORMAL HIGH (ref 1.4–6.5)
Neutrophil %: 86.8 %
Platelet: 382 10*3/uL (ref 150–440)
RBC: 4.27 10*6/uL — ABNORMAL LOW (ref 4.40–5.90)
RDW: 18.1 % — ABNORMAL HIGH (ref 11.5–14.5)
WBC: 13.5 10*3/uL — ABNORMAL HIGH (ref 3.8–10.6)

## 2013-06-01 LAB — TROPONIN I

## 2013-06-01 LAB — PROTIME-INR
INR: 1.1
PROTHROMBIN TIME: 13.6 s (ref 11.5–14.7)

## 2013-06-01 LAB — APTT: Activated PTT: 36.6 secs — ABNORMAL HIGH (ref 23.6–35.9)

## 2013-06-02 LAB — CBC WITH DIFFERENTIAL/PLATELET
Basophil #: 0.1 10*3/uL (ref 0.0–0.1)
Basophil %: 0.6 %
EOS ABS: 0 10*3/uL (ref 0.0–0.7)
Eosinophil %: 0.3 %
HCT: 38.7 % — ABNORMAL LOW (ref 40.0–52.0)
HGB: 12.6 g/dL — ABNORMAL LOW (ref 13.0–18.0)
LYMPHS ABS: 0.6 10*3/uL — AB (ref 1.0–3.6)
Lymphocyte %: 5.2 %
MCH: 33.5 pg (ref 26.0–34.0)
MCHC: 32.4 g/dL (ref 32.0–36.0)
MCV: 103 fL — ABNORMAL HIGH (ref 80–100)
Monocyte #: 0.8 x10 3/mm (ref 0.2–1.0)
Monocyte %: 7.4 %
Neutrophil #: 9.3 10*3/uL — ABNORMAL HIGH (ref 1.4–6.5)
Neutrophil %: 86.5 %
Platelet: 318 10*3/uL (ref 150–440)
RBC: 3.74 10*6/uL — AB (ref 4.40–5.90)
RDW: 17.4 % — AB (ref 11.5–14.5)
WBC: 10.7 10*3/uL — AB (ref 3.8–10.6)

## 2013-06-02 LAB — BASIC METABOLIC PANEL
ANION GAP: 6 — AB (ref 7–16)
BUN: 10 mg/dL (ref 7–18)
CALCIUM: 8.3 mg/dL — AB (ref 8.5–10.1)
Chloride: 110 mmol/L — ABNORMAL HIGH (ref 98–107)
Co2: 26 mmol/L (ref 21–32)
Creatinine: 0.85 mg/dL (ref 0.60–1.30)
EGFR (Non-African Amer.): >60
Glucose: 91 mg/dL (ref 65–99)
OSMOLALITY: 282 (ref 275–301)
POTASSIUM: 3.6 mmol/L (ref 3.5–5.1)
Sodium: 142 mmol/L (ref 136–145)

## 2013-06-03 LAB — URINE CULTURE

## 2013-06-06 LAB — CULTURE, BLOOD (SINGLE)

## 2013-07-10 ENCOUNTER — Ambulatory Visit: Payer: Self-pay | Admitting: Internal Medicine

## 2013-07-11 LAB — HEPATIC FUNCTION PANEL A (ARMC)
Albumin: 3.6 g/dL (ref 3.4–5.0)
Alkaline Phosphatase: 46 U/L
BILIRUBIN TOTAL: 0.4 mg/dL (ref 0.2–1.0)
Bilirubin, Direct: 0.1 mg/dL (ref 0.00–0.20)
SGOT(AST): 17 U/L (ref 15–37)
SGPT (ALT): 25 U/L (ref 12–78)
Total Protein: 6.9 g/dL (ref 6.4–8.2)

## 2013-07-11 LAB — CBC CANCER CENTER
Basophil #: 0.1 x10 3/mm (ref 0.0–0.1)
Basophil %: 1.4 %
Eosinophil #: 0.1 x10 3/mm (ref 0.0–0.7)
Eosinophil %: 1.7 %
HCT: 44.3 % (ref 40.0–52.0)
HGB: 14.5 g/dL (ref 13.0–18.0)
LYMPHS PCT: 9 %
Lymphocyte #: 0.6 x10 3/mm — ABNORMAL LOW (ref 1.0–3.6)
MCH: 34.8 pg — AB (ref 26.0–34.0)
MCHC: 32.7 g/dL (ref 32.0–36.0)
MCV: 106 fL — AB (ref 80–100)
MONO ABS: 0.7 x10 3/mm (ref 0.2–1.0)
Monocyte %: 9.6 %
Neutrophil #: 5.4 x10 3/mm (ref 1.4–6.5)
Neutrophil %: 78.3 %
Platelet: 474 x10 3/mm — ABNORMAL HIGH (ref 150–440)
RBC: 4.16 10*6/uL — ABNORMAL LOW (ref 4.40–5.90)
RDW: 17.9 % — ABNORMAL HIGH (ref 11.5–14.5)
WBC: 6.9 x10 3/mm (ref 3.8–10.6)

## 2013-07-11 LAB — FERRITIN: FERRITIN (ARMC): 17 ng/mL (ref 8–388)

## 2013-07-11 LAB — CREATININE, SERUM
CREATININE: 0.92 mg/dL (ref 0.60–1.30)
EGFR (African American): >60
EGFR (Non-African Amer.): >60

## 2013-08-06 ENCOUNTER — Ambulatory Visit: Payer: Self-pay | Admitting: Internal Medicine

## 2013-09-05 LAB — CBC CANCER CENTER
Basophil #: 0 x10 3/mm (ref 0.0–0.1)
Basophil %: 0.3 %
EOS PCT: 3.3 %
Eosinophil #: 0.2 x10 3/mm (ref 0.0–0.7)
HCT: 46.9 % (ref 40.0–52.0)
HGB: 15.8 g/dL (ref 13.0–18.0)
Lymphocyte #: 0.8 x10 3/mm — ABNORMAL LOW (ref 1.0–3.6)
Lymphocyte %: 12.6 %
MCH: 37.9 pg — ABNORMAL HIGH (ref 26.0–34.0)
MCHC: 33.7 g/dL (ref 32.0–36.0)
MCV: 112 fL — ABNORMAL HIGH (ref 80–100)
Monocyte #: 0.8 x10 3/mm (ref 0.2–1.0)
Monocyte %: 11.8 %
Neutrophil #: 4.6 x10 3/mm (ref 1.4–6.5)
Neutrophil %: 72 %
Platelet: 475 x10 3/mm — ABNORMAL HIGH (ref 150–440)
RBC: 4.17 10*6/uL — ABNORMAL LOW (ref 4.40–5.90)
RDW: 15.9 % — ABNORMAL HIGH (ref 11.5–14.5)
WBC: 6.4 x10 3/mm (ref 3.8–10.6)

## 2013-09-06 ENCOUNTER — Ambulatory Visit: Payer: Self-pay | Admitting: Internal Medicine

## 2013-10-31 ENCOUNTER — Ambulatory Visit: Payer: Self-pay | Admitting: Internal Medicine

## 2013-10-31 LAB — CBC CANCER CENTER
Basophil #: 0.1 x10 3/mm (ref 0.0–0.1)
Basophil %: 1 %
Eosinophil #: 0.1 x10 3/mm (ref 0.0–0.7)
Eosinophil %: 1.9 %
HCT: 47 % (ref 40.0–52.0)
HGB: 15.6 g/dL (ref 13.0–18.0)
LYMPHS PCT: 13 %
Lymphocyte #: 0.7 x10 3/mm — ABNORMAL LOW (ref 1.0–3.6)
MCH: 38.1 pg — AB (ref 26.0–34.0)
MCHC: 33.3 g/dL (ref 32.0–36.0)
MCV: 115 fL — AB (ref 80–100)
MONO ABS: 0.7 x10 3/mm (ref 0.2–1.0)
MONOS PCT: 12.3 %
NEUTROS PCT: 71.8 %
Neutrophil #: 3.9 x10 3/mm (ref 1.4–6.5)
Platelet: 443 x10 3/mm — ABNORMAL HIGH (ref 150–440)
RBC: 4.1 10*6/uL — ABNORMAL LOW (ref 4.40–5.90)
RDW: 14.6 % — ABNORMAL HIGH (ref 11.5–14.5)
WBC: 5.5 x10 3/mm (ref 3.8–10.6)

## 2013-10-31 LAB — CREATININE, SERUM
CREATININE: 1.13 mg/dL (ref 0.60–1.30)
EGFR (African American): >60
EGFR (Non-African Amer.): >60

## 2013-11-06 ENCOUNTER — Ambulatory Visit: Payer: Self-pay | Admitting: Internal Medicine

## 2013-11-21 IMAGING — CT CT CHEST W/ CM
2 series · 15 of 31 positions shown, 19 images · IV contrast (APPLIED)
Comparison: None

REASON FOR EXAM: pleuritic pain
COMMENTS:

PROCEDURE:     CT  - CT CHEST (FOR PE) W  - September 17, 2011 [DATE]
RESULT:     Indications: Chest Pain
TECHNIQUE: A thin-section spiral CT from the lung apices to the upper
abdomen was acquired on a multi slice scanner following 100ml [id number]
intravenous contrast. These images were then transferred to the Siemens work
station and were subsequently reviewed utilizing 3-D reconstructions and MIP
images.

[Series 4: soft tissue · axial · 0.79mm/px · z∈[-166,-118]mm · 2 of 103 slices shown]
[im 8/103  mediastinal]
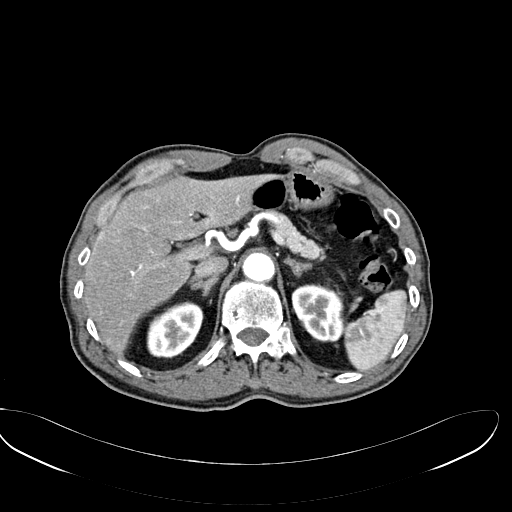
[im 24/103  mediastinal]
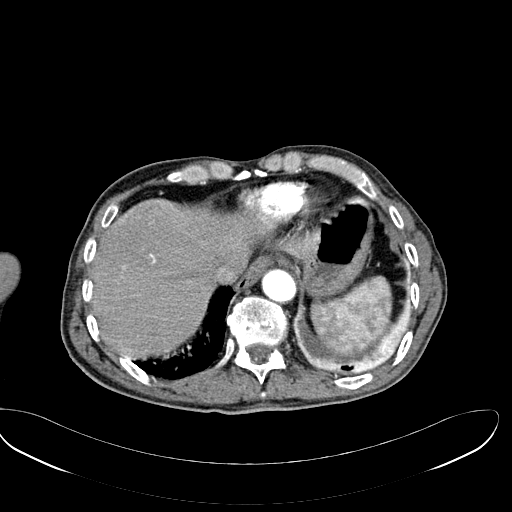

[Series 5: lung windows · axial · 0.79mm/px · z∈[-158,+94]mm · 13 of 100 slices shown, 17 images]
[im 8/100  mediastinal]
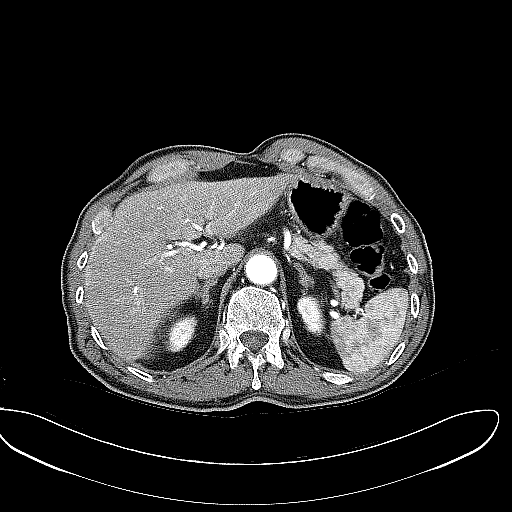
[im 8/100  lung]
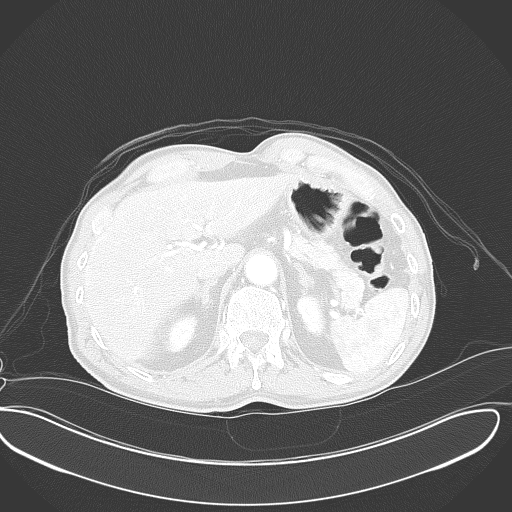
[im 16/100  lung]
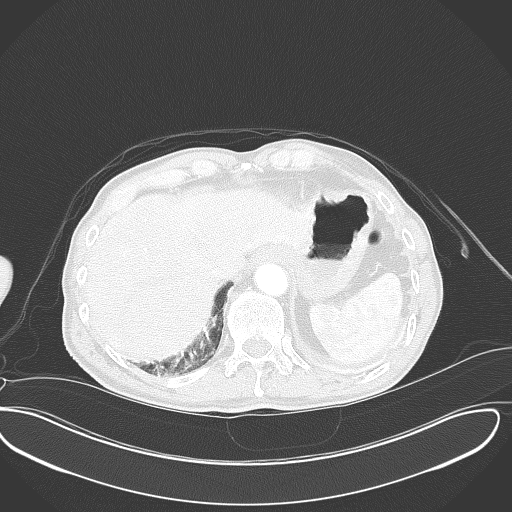
[im 23/100  lung]
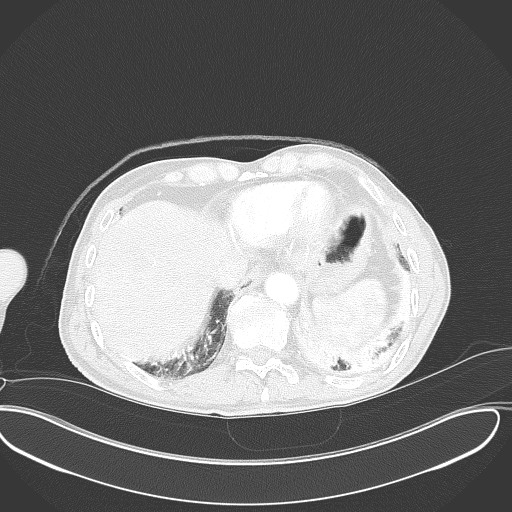
[im 31/100  lung]
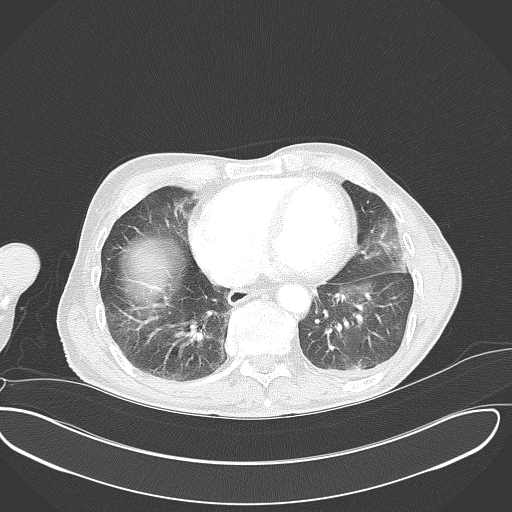
[im 39/100  mediastinal]
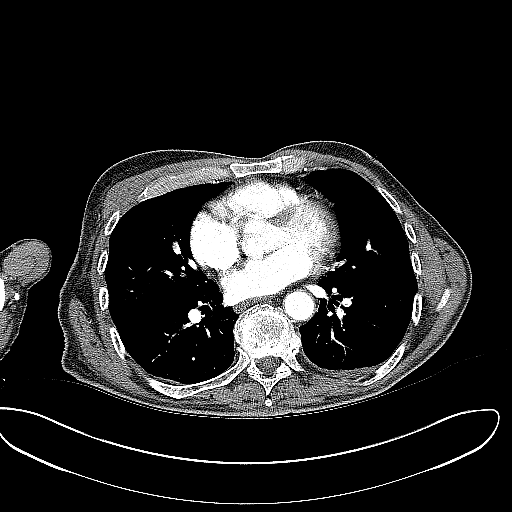
[im 39/100  lung]
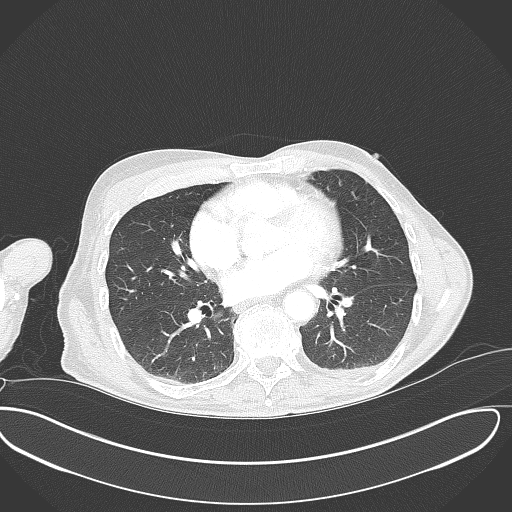
[im 46/100  lung]
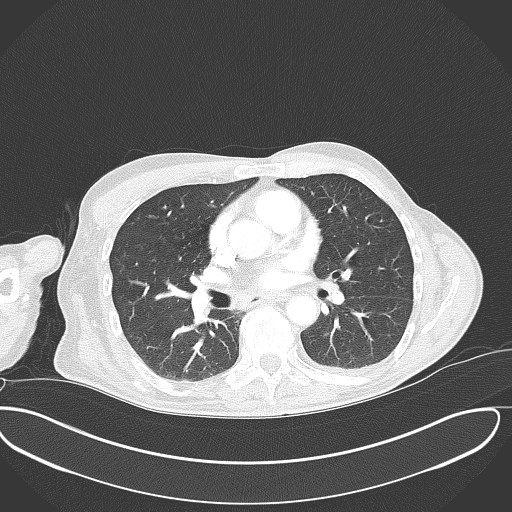
[im 50/100  lung]
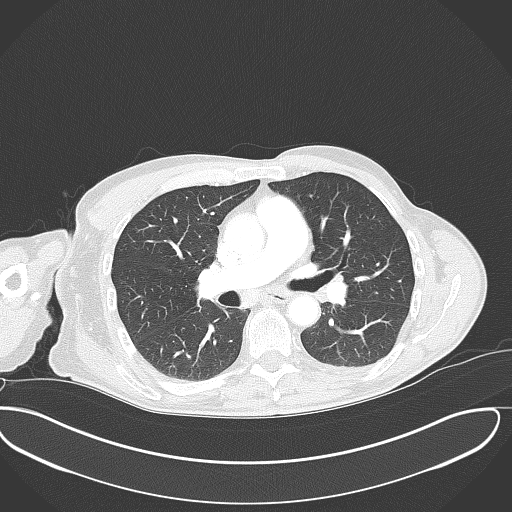
[im 54/100  lung]
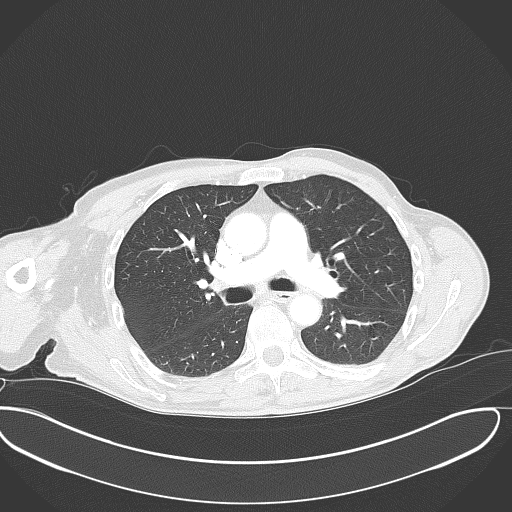
[im 61/100  mediastinal]
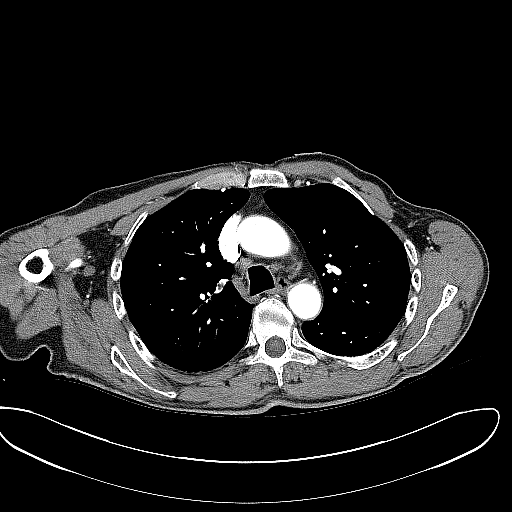
[im 61/100  lung]
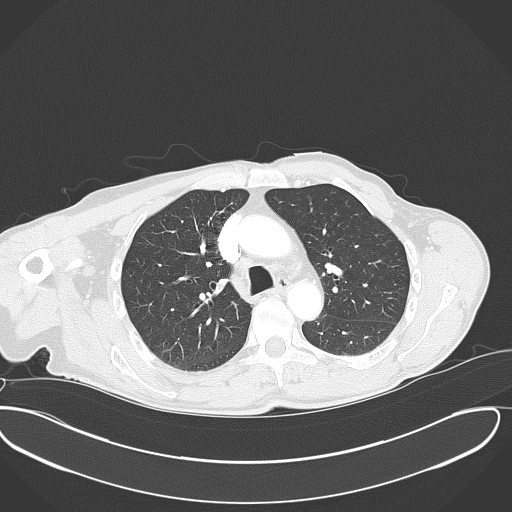
[im 69/100  lung]
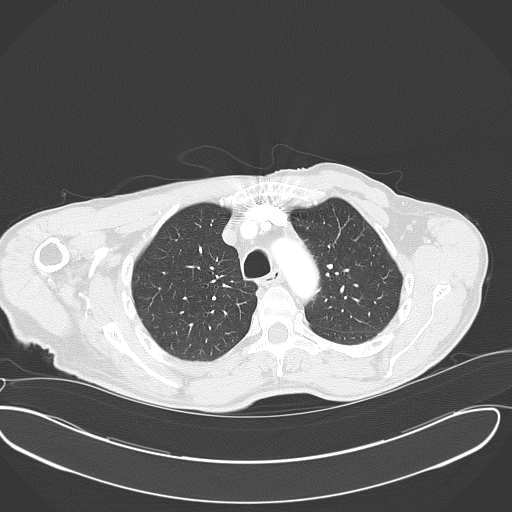
[im 77/100  lung]
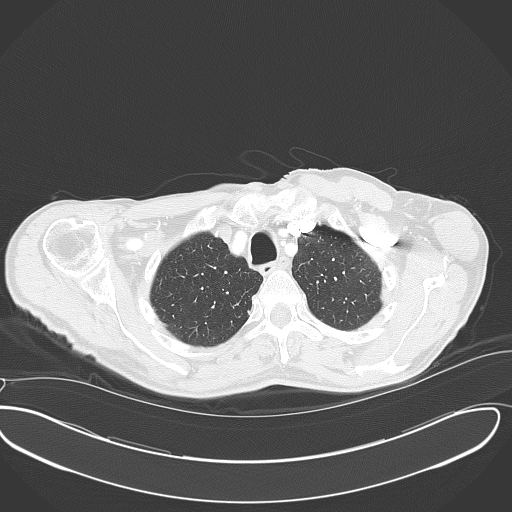
[im 84/100  lung]
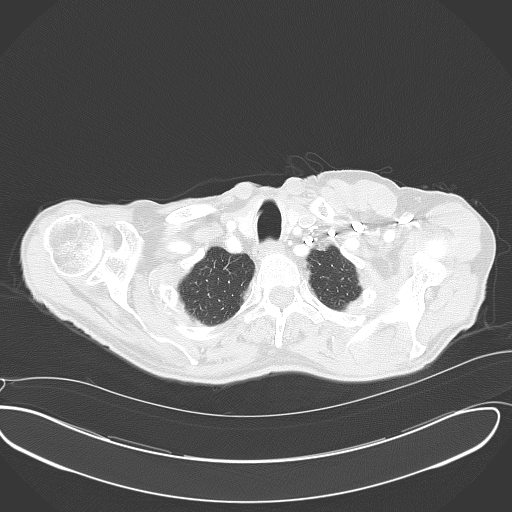
[im 92/100  mediastinal]
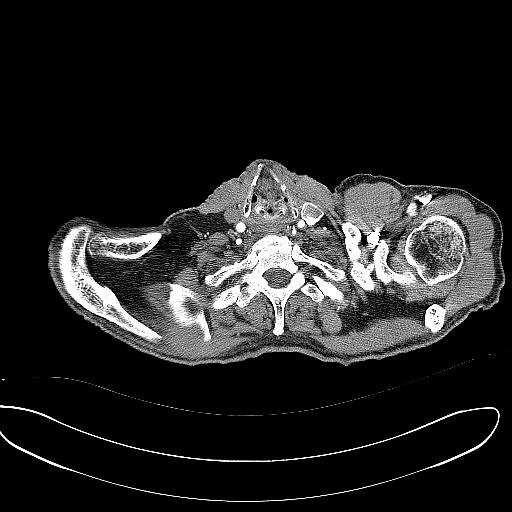
[im 92/100  lung]
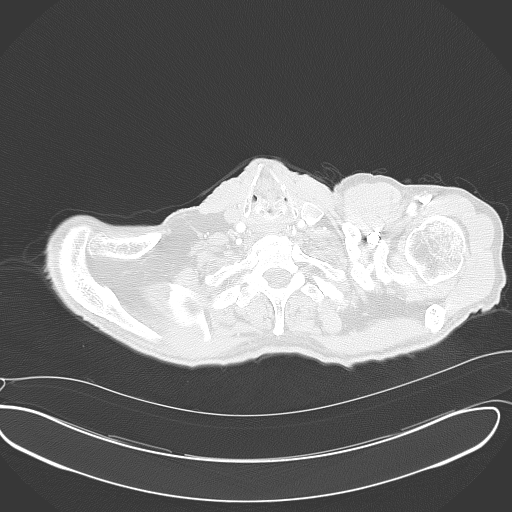

[15 of 31 positions shown; findings below may reference images not displayed]

FINDINGS: There is adequate opacification of the pulmonary arteries. There multiple
small filling defects within the segmental and subsegmental pulmonary artery
segments involving the bilateral upper lobes. The main pulmonary artery,
right main pulmonary artery, and left main pulmonary arteries are normal in
size. The heart size is normal. There is no pericardial effusion.

The lungs are clear. There is a trace left pleural effusion. There is left
basilar atelectasis. There is no focal consolidation or pneumothorax.

There is no axillary, hilar, or mediastinal adenopathy.

There is an old ununited left clavicular fracture.

The visualized portions of the upper abdomen are unremarkable.
IMPRESSION: 1.  There multiple small filling defects within the segmental and
subsegmental pulmonary artery segments involving the bilateral upper lobes.
These findings were communicated to Dr. Manzo on 09/17/2011 at 0082 hours.

[REDACTED]

## 2013-11-22 IMAGING — US US EXTREM LOW VENOUS BILAT
1 series · 13 of 24 positions shown · non-contrast
Comparison: none

REASON FOR EXAM: chest pain,  PE, r/o DVT
COMMENTS:

PROCEDURE:     US  - US DOPPLER LOW EXTR BILATERAL  - September 18, 2011 [DATE]
RESULT:     Bilateral lower extremity deep venous Doppler ultrasound dated
09/27/2011.
TECHNIQUE: Real time sonographic imaging of the right and left lower
extremities are obtained utilizing color flow, duplex Doppler, spectral
waveform and grayscale imaging.

[Series 1: us extrem low venous bilat · 0.09mm/px · 13 of 63 slices shown]
[im 1/63]
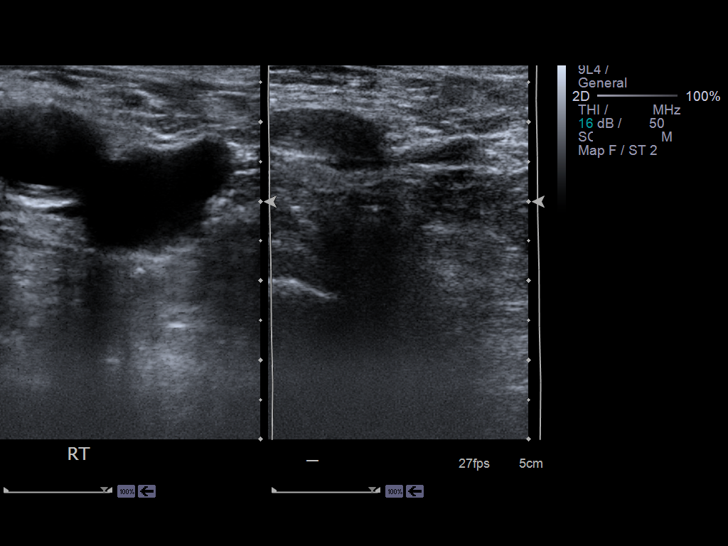
[im 6/63]
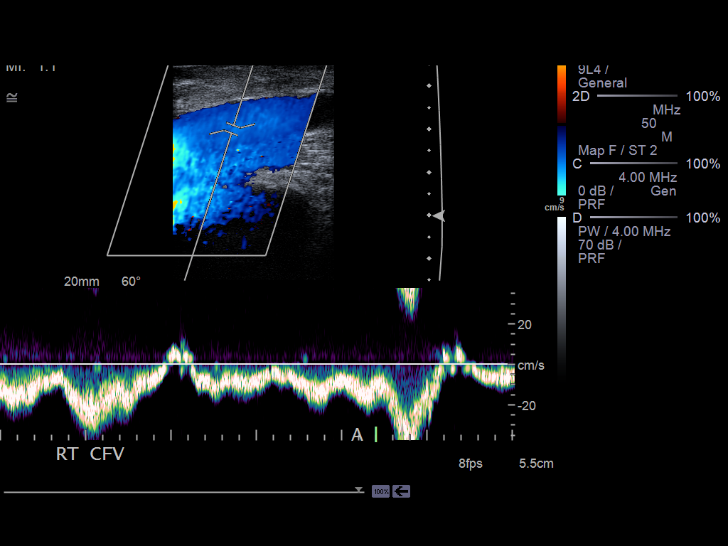
[im 11/63]
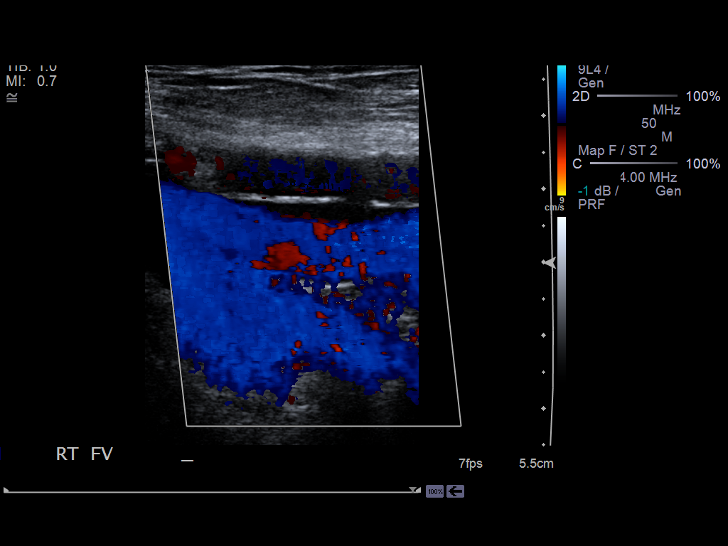
[im 17/63]
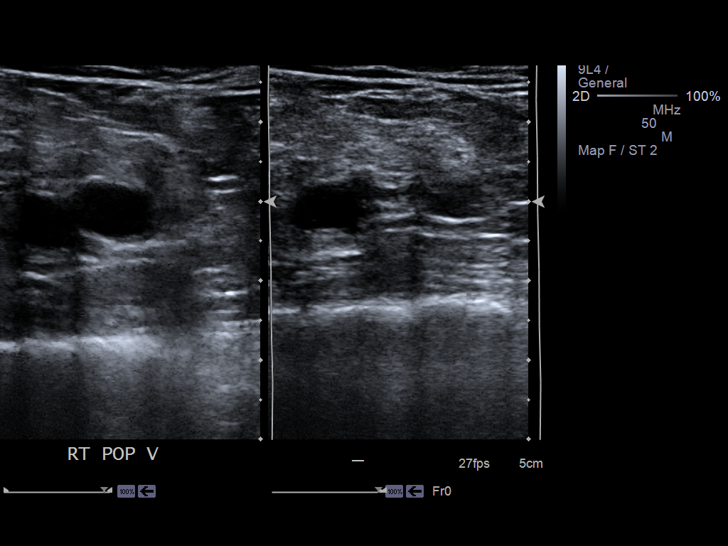
[im 22/63]
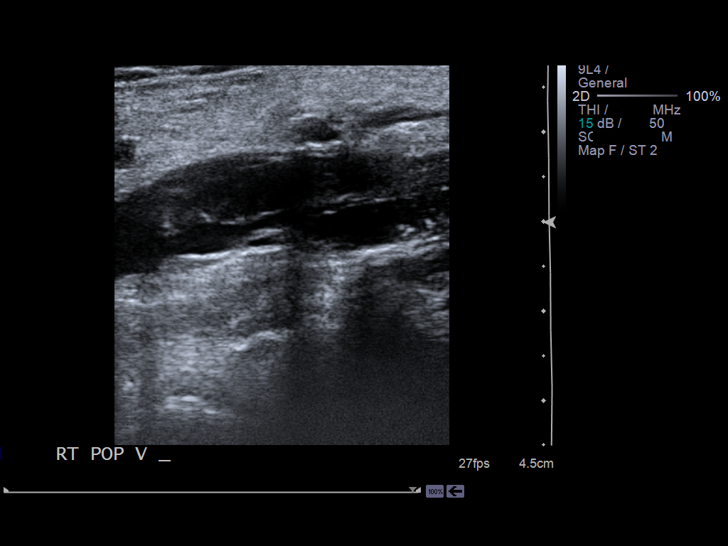
[im 27/63]
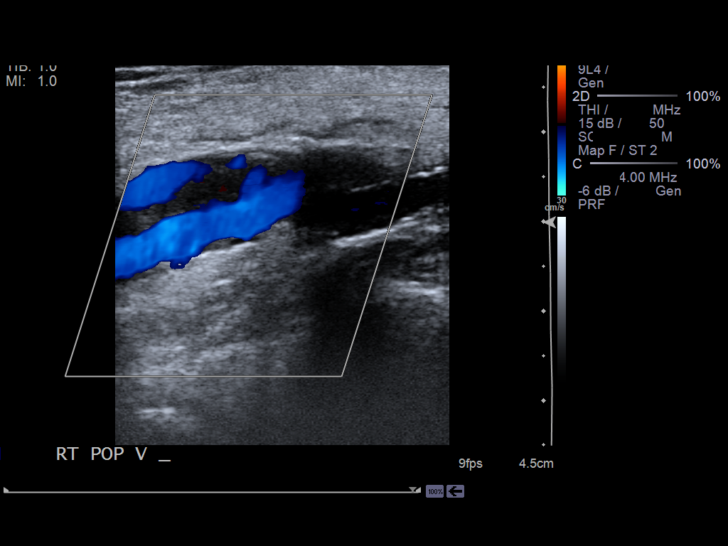
[im 33/63]
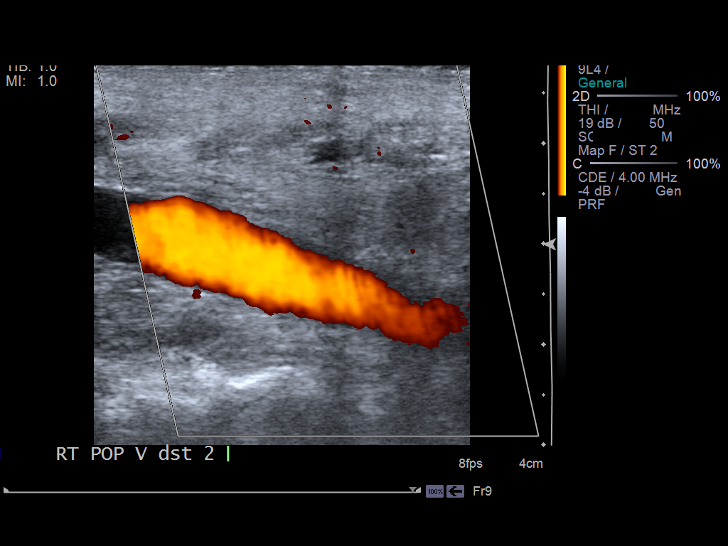
[im 36/63]
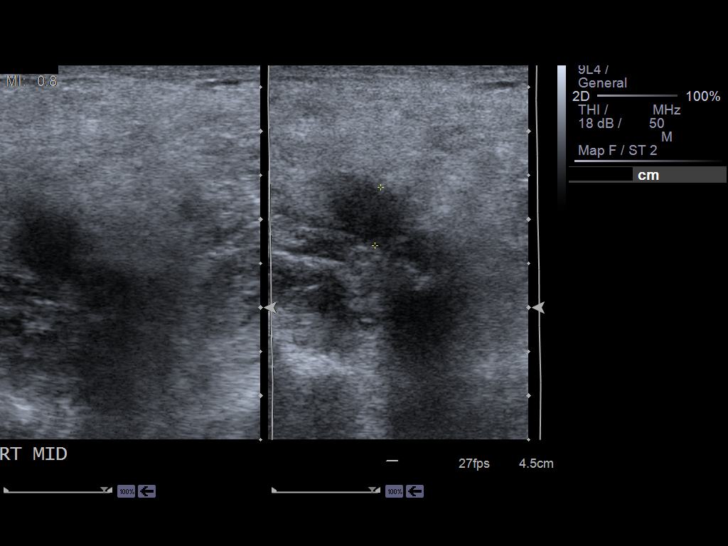
[im 41/63]
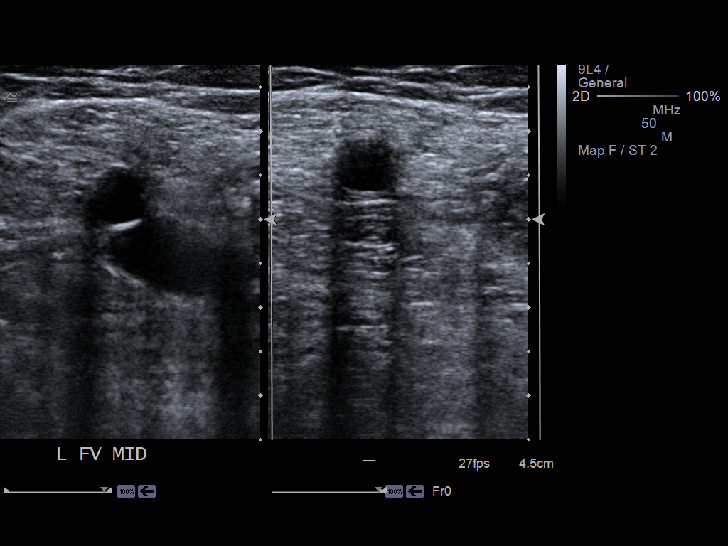
[im 46/63]
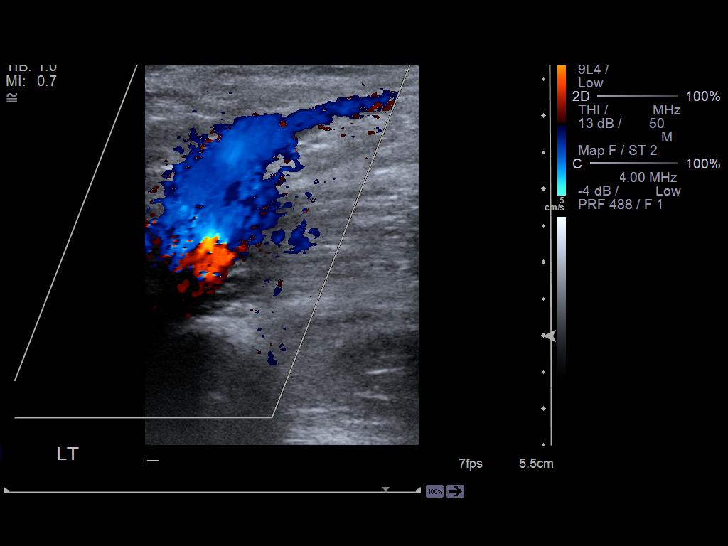
[im 52/63]
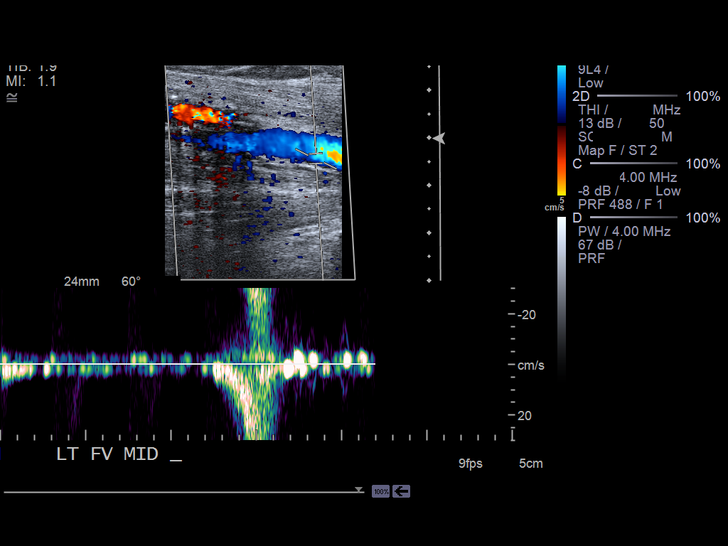
[im 57/63]
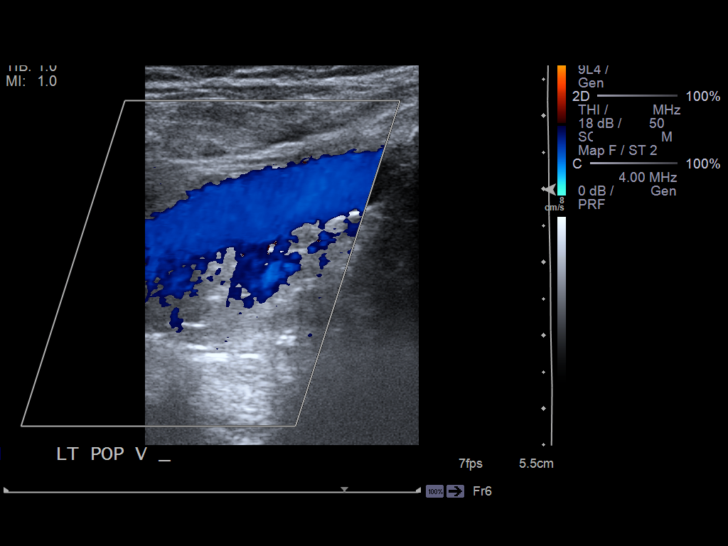
[im 63/63]
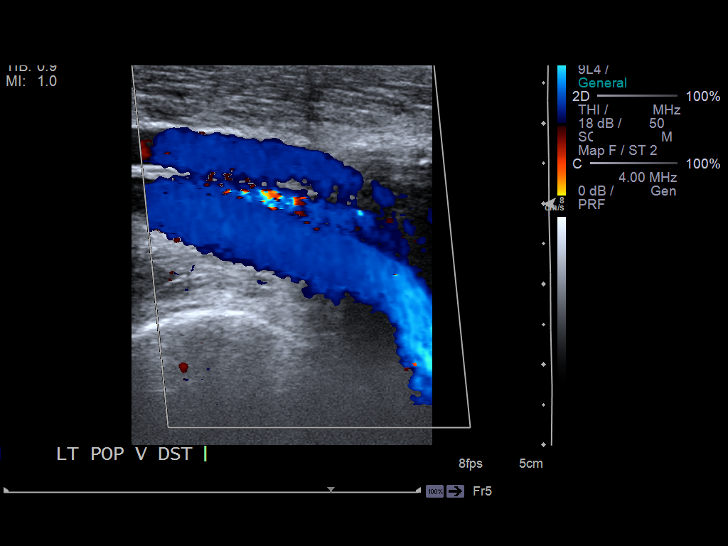

[13 of 24 positions shown; findings below may reference images not displayed]

Evaluation of the common and superficial femoral veins on the right
demonstrate appropriate color filling without evidence of abnormal
compressibility or abnormal foci of increased echogenicity. There is
appropriate response to Valsalva and augmentation.

The popliteal vein on the right demonstrates an area of incomplete
compressibility and increased echogenicity. There is partial color filling
of this area as well as abnormal spectral waveform imaging. This area
extends into the distal popliteal vein where there is absent color filling
and noncompressibility. No appreciable waveform elicited within this region.

The deep venous structures within the left lower extremity demonstrate no
evidence of foci of increased echogenicity. There is appropriate color
filling of the vessels as well as appropriate spectral waveform imaging. The
vessels are compressible and there is appropriate response to augmentation
and Valsalva.
IMPRESSION: Thrombus within the popliteal vein on the right
demonstrating areas of partial occlusion and distal complete occlusion.
2. These findings were relayed to the patient's floor nurse at [DATE] a.m.
09/18/2011 to be conveyed to the patient's attending physician.
3. No evidence of deep venous thrombosis within the interrogated venous
structures of left lower extremity.
(*)

## 2013-12-08 IMAGING — CT CT HEAD WITHOUT CONTRAST
1 series · 16 of 30 positions shown, 20 images · non-contrast
Comparison: none

REASON FOR EXAM: CVA
COMMENTS:   May transport without cardiac monitor

PROCEDURE:     CT  - CT HEAD WITHOUT CONTRAST  - October 04, 2011  [DATE]
RESULT:     Head CT dated 10/04/2011.
TECHNIQUE: Helical noncontrasted 5 mm sections were obtained from the skull
base to the vertex.

[Series 2: soft tissue · axial · 0.43mm/px · z∈[-124,+16]mm · 16 of 32 slices shown, 20 images]
[im 2/32  brain]
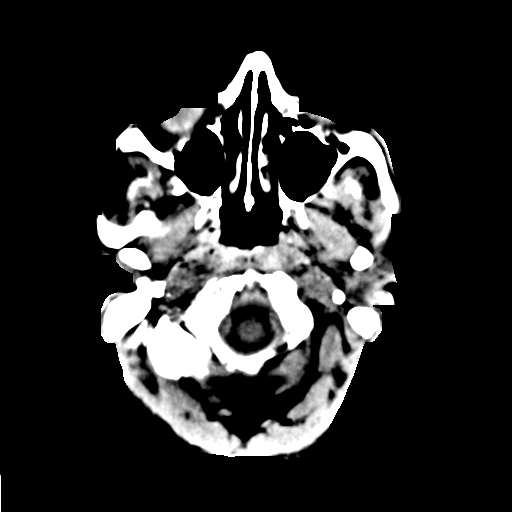
[im 2/32  bone]
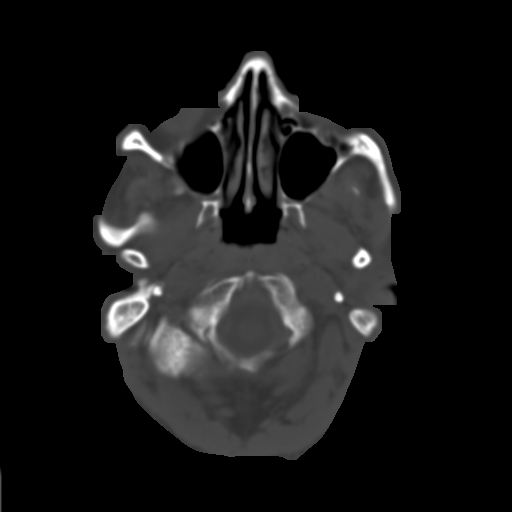
[im 4/32  brain]
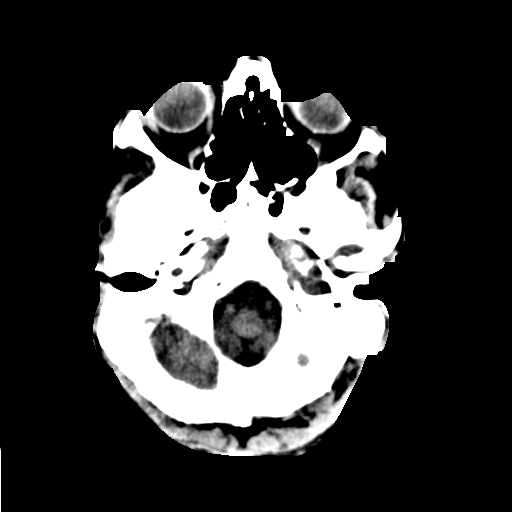
[im 6/32  brain]
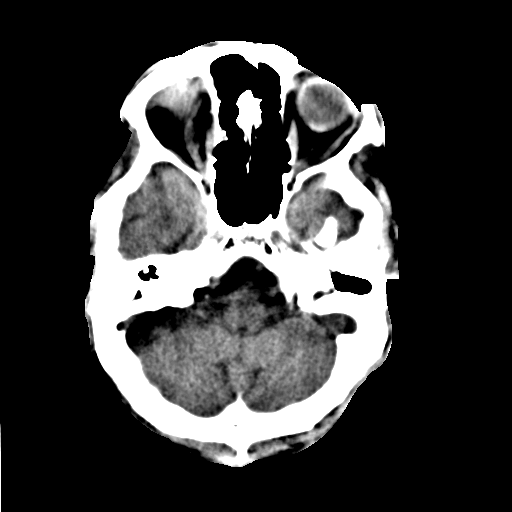
[im 8/32  brain]
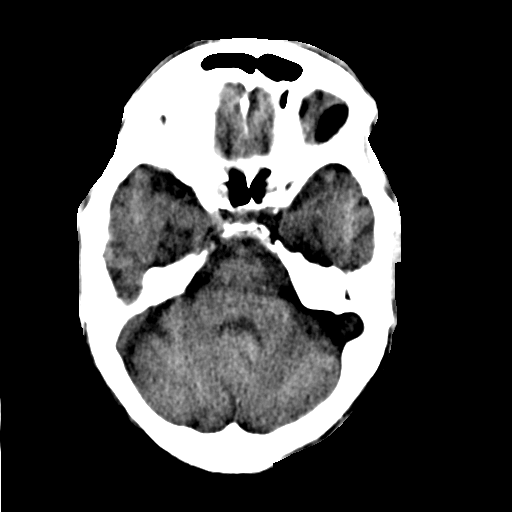
[im 9/32  brain]
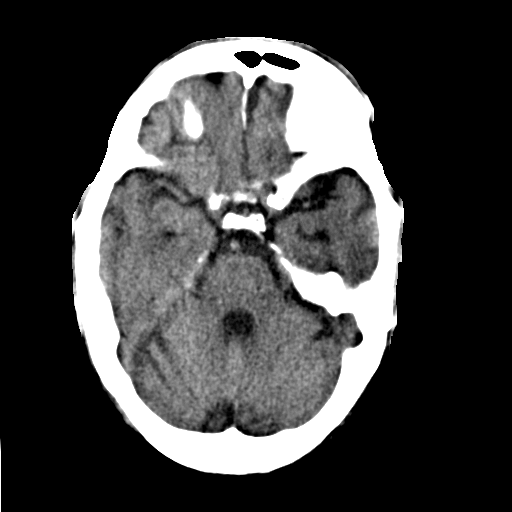
[im 9/32  bone]
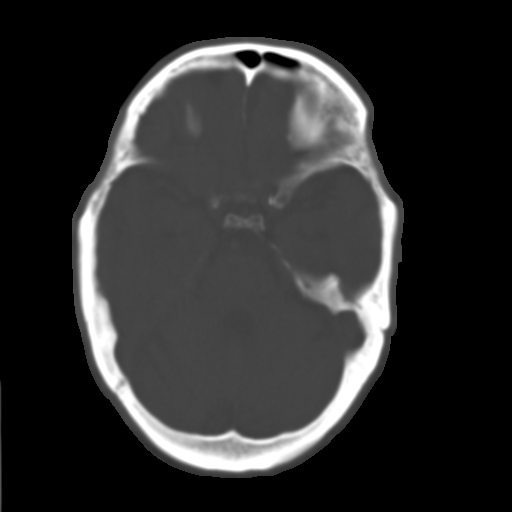
[im 11/32  brain]
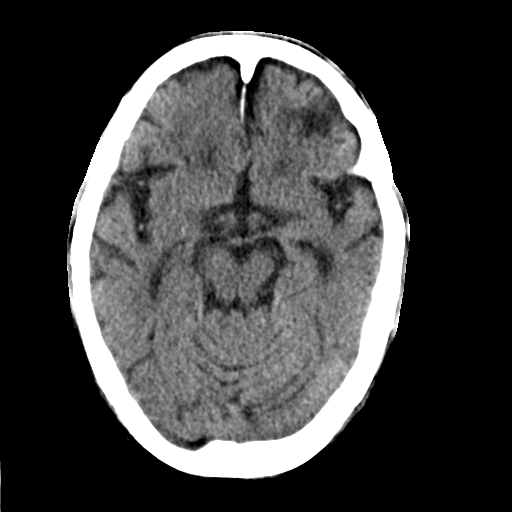
[im 13/32  brain]
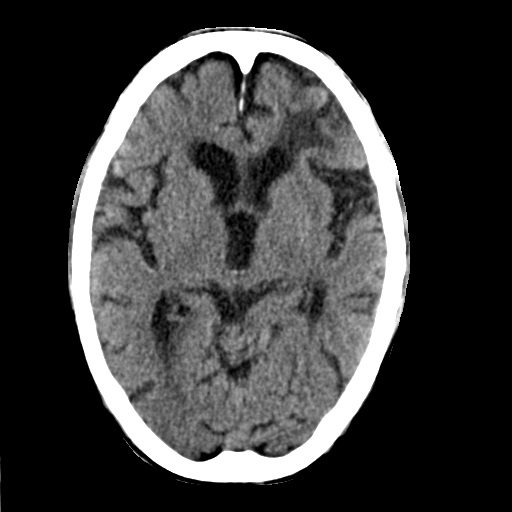
[im 15/32  brain]
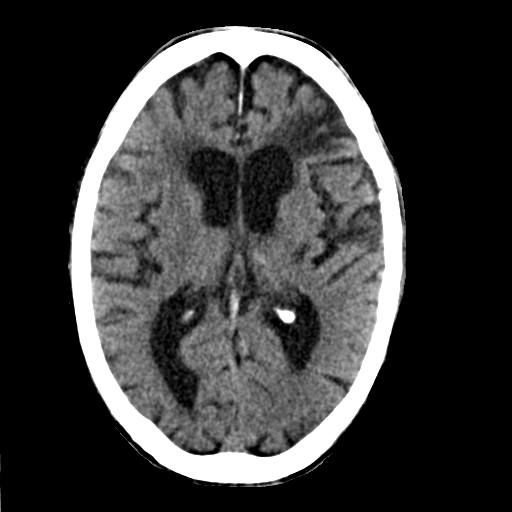
[im 17/32  brain]
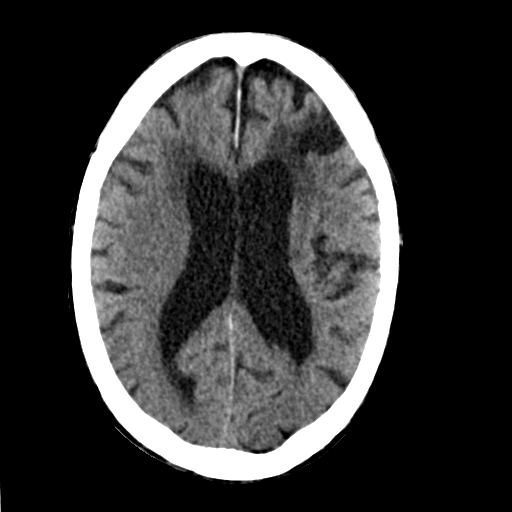
[im 17/32  bone]
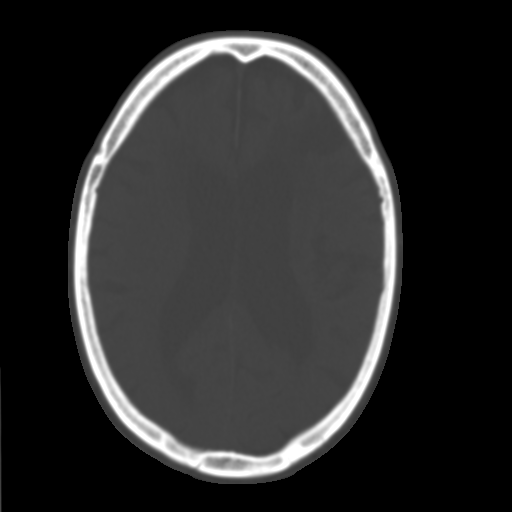
[im 19/32  brain]
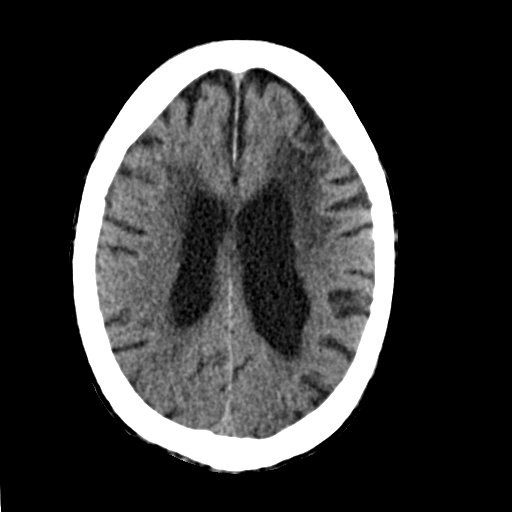
[im 21/32  brain]
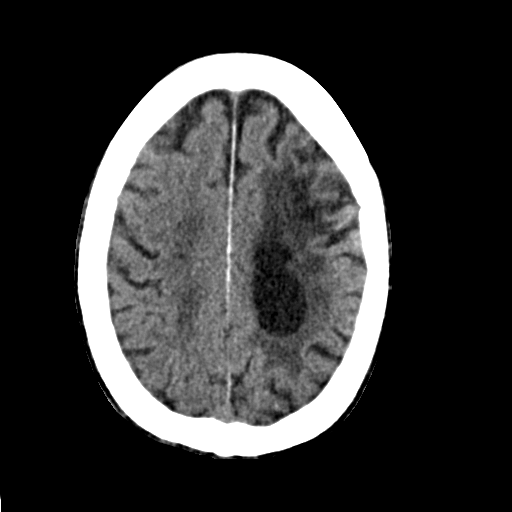
[im 23/32  brain]
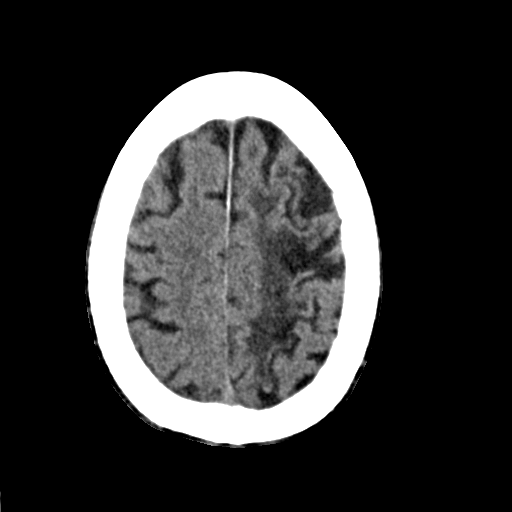
[im 24/32  brain]
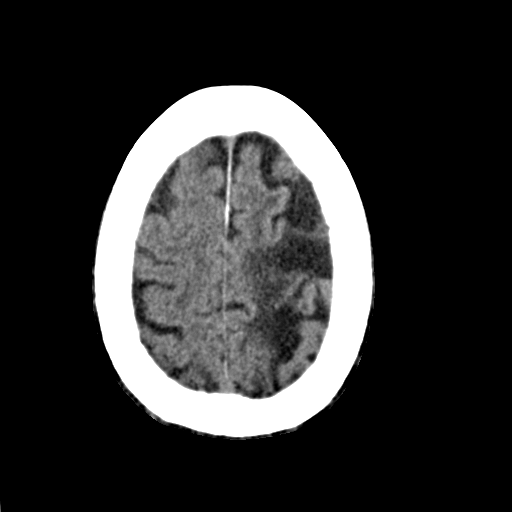
[im 24/32  bone]
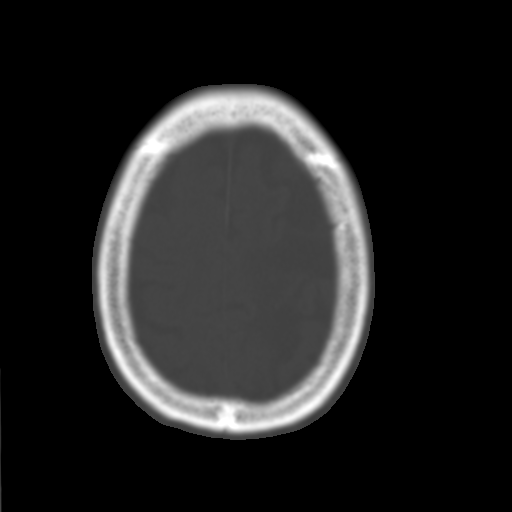
[im 26/32  brain]
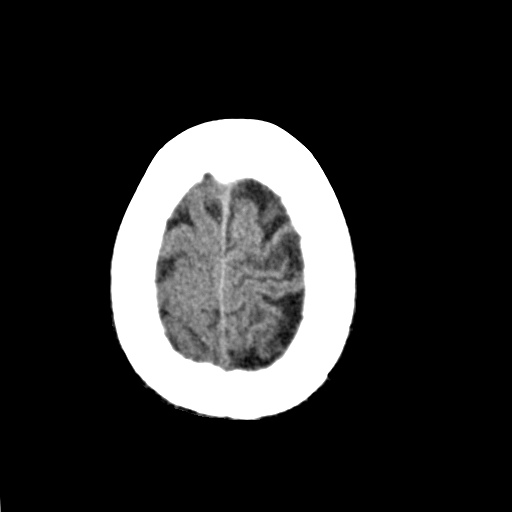
[im 28/32  brain]
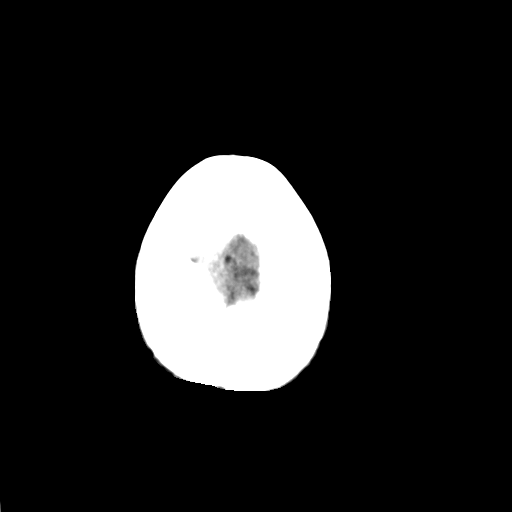
[im 30/32  brain]
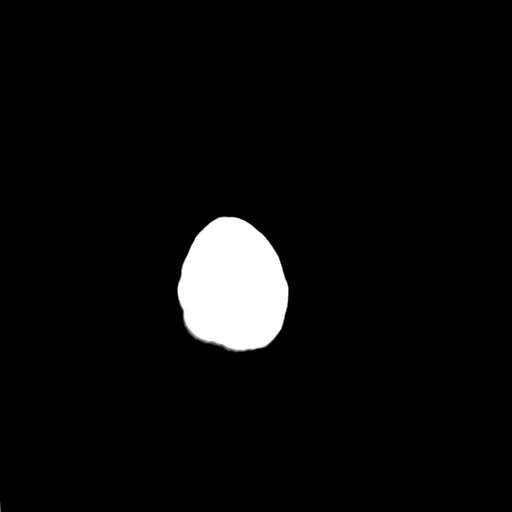

[16 of 30 positions shown; findings below may reference images not displayed]

FINDINGS: There is diffuse cortical atrophy. An area of diffuse
low-attenuation projects within the peripheral vertex of the left frontal
region. There are are associated regions of encephalomalacic change. There
is no evidence of acute hemorrhage. Diffuse areas of low-attenuation project
within the subcortical, deep, and periventricular white matter regions.
Hydrocephalus ex vacuo appreciated. There is no evidence of subfalcine nor
tonsillar herniation. There atrophy is appreciated. There is no evidence of
a depressed skull fracture. The visualized paranasal sinuses and mastoid air
cells are patent.
IMPRESSION: Chronic and involutional changes without evidence of acute
abnormalities.

## 2013-12-26 ENCOUNTER — Ambulatory Visit: Payer: Self-pay | Admitting: Internal Medicine

## 2013-12-26 LAB — CBC CANCER CENTER
BASOS ABS: 0.1 x10 3/mm (ref 0.0–0.1)
Basophil %: 1.4 %
Eosinophil #: 0.1 x10 3/mm (ref 0.0–0.7)
Eosinophil %: 2 %
HCT: 46.3 % (ref 40.0–52.0)
HGB: 15.2 g/dL (ref 13.0–18.0)
LYMPHS ABS: 0.9 x10 3/mm — AB (ref 1.0–3.6)
LYMPHS PCT: 14.3 %
MCH: 37.9 pg — ABNORMAL HIGH (ref 26.0–34.0)
MCHC: 32.8 g/dL (ref 32.0–36.0)
MCV: 115 fL — AB (ref 80–100)
MONO ABS: 0.9 x10 3/mm (ref 0.2–1.0)
MONOS PCT: 14.3 %
NEUTROS ABS: 4.3 x10 3/mm (ref 1.4–6.5)
NEUTROS PCT: 68 %
Platelet: 428 x10 3/mm (ref 150–440)
RBC: 4.01 10*6/uL — ABNORMAL LOW (ref 4.40–5.90)
RDW: 13.8 % (ref 11.5–14.5)
WBC: 6.4 x10 3/mm (ref 3.8–10.6)

## 2014-01-06 ENCOUNTER — Ambulatory Visit: Payer: Self-pay | Admitting: Internal Medicine

## 2014-02-20 ENCOUNTER — Ambulatory Visit: Payer: Self-pay | Admitting: Internal Medicine

## 2014-02-20 LAB — CREATININE, SERUM
CREATININE: 1.02 mg/dL (ref 0.60–1.30)
EGFR (Non-African Amer.): >60

## 2014-02-20 LAB — HEPATIC FUNCTION PANEL A (ARMC)
ALK PHOS: 60 U/L
ALT: 20 U/L
AST: 17 U/L (ref 15–37)
Albumin: 3.6 g/dL (ref 3.4–5.0)
Bilirubin,Total: 0.5 mg/dL (ref 0.2–1.0)
Total Protein: 7.1 g/dL (ref 6.4–8.2)

## 2014-02-20 LAB — CBC CANCER CENTER
BASOS ABS: 0.1 x10 3/mm (ref 0.0–0.1)
Basophil %: 1.1 %
Eosinophil #: 0.1 x10 3/mm (ref 0.0–0.7)
Eosinophil %: 1.9 %
HCT: 47.1 % (ref 40.0–52.0)
HGB: 15.8 g/dL (ref 13.0–18.0)
Lymphocyte #: 0.6 x10 3/mm — ABNORMAL LOW (ref 1.0–3.6)
Lymphocyte %: 7.4 %
MCH: 38.3 pg — ABNORMAL HIGH (ref 26.0–34.0)
MCHC: 33.4 g/dL (ref 32.0–36.0)
MCV: 115 fL — AB (ref 80–100)
Monocyte #: 0.9 x10 3/mm (ref 0.2–1.0)
Monocyte %: 12 %
Neutrophil #: 5.8 x10 3/mm (ref 1.4–6.5)
Neutrophil %: 77.6 %
PLATELETS: 417 x10 3/mm (ref 150–440)
RBC: 4.11 10*6/uL — AB (ref 4.40–5.90)
RDW: 15 % — ABNORMAL HIGH (ref 11.5–14.5)
WBC: 7.5 x10 3/mm (ref 3.8–10.6)

## 2014-02-20 LAB — FERRITIN: Ferritin (ARMC): 36 ng/mL (ref 8–388)

## 2014-03-09 ENCOUNTER — Ambulatory Visit: Payer: Self-pay | Admitting: Internal Medicine

## 2014-04-17 ENCOUNTER — Ambulatory Visit
Admit: 2014-04-17 | Disposition: A | Discharge status: Home / Self Care | Payer: Self-pay | Attending: Internal Medicine | Admitting: Internal Medicine

## 2014-05-08 ENCOUNTER — Ambulatory Visit
Admit: 2014-05-08 | Disposition: A | Discharge status: Home / Self Care | Payer: Self-pay | Attending: Internal Medicine | Admitting: Internal Medicine

## 2014-05-26 NOTE — Discharge Summary (Signed)
PATIENT NAME:  Erik [name], LIA MR#:  [id number] DATE OF BIRTH:  [birth year]  DATE OF ADMISSION:  10/04/2011 DATE OF DISCHARGE:  10/09/2011   Discharge is pending bed availability at rehab.   PRESENTING COMPLAINT: Left arm weakness.   DISCHARGE DIAGNOSES:  1. Acute cerebrovascular accident with left-sided weakness, appears due to embolic process. MRI of the brain shows acute subacute regions of infarction in the left and right frontal and right posterior parietal lobe.  2. History of cerebrovascular accident in the past with right upper extremity weakness, chronic.  3. History of coronary artery occlusion on the left status post surgery. The patient currently has right ICA 75% stenosis. Follow-up as outpatient with Vascular.  4. Polycythemia varus, on hydroxyurea.  5. Hypertension.  6. Recent history of DVT and PE, on Coumadin.   CODE STATUS: FULL CODE.   MEDICATIONS AT DISCHARGE:  1. Simvastatin 40 mg at bedtime.  2. Hydroxyurea 500 mg daily.  3. Omeprazole 20 mg daily.  4. Amlodipine 10 mg daily.  5. Benazepril 40 mg daily.  6. Warfarin 4 mg daily.   CONSULTATIONS:  1. Dr. Olin Pia of Neurology  2. Dr. Wyn Quaker of Vascular   3. Dr. Sherrlyn Hock of Hematology/Oncology    LABS AT DISCHARGE: PT-INR 25.7 and 2.3. Lipid profile within normal limits. Hemoglobin and hematocrit 11.5 and 34.3. Platelet count is 340. Urinalysis negative for urinary tract infection. CBC otherwise within normal limits. LFTs within normal limits. Cardiac enzymes negative.   EKG shows normal sinus rhythm.   RADIOLOGICAL STUDIES:  1. Chest x-ray on admission no acute cardiopulmonary abnormality.  2. CT of the head August 28th shows chronic involutional changes without evidence of acute abnormalities.  3. Carotid Doppler shows occlusion of the left internal carotid artery as seen in prior. Findings represent 50 to 75% stenosis of the proximal right internal carotid artery versus elevated velocities secondary to shunting of  the blood from left carotid artery occlusion.  4. Echo Doppler no cardiac source of emboli noted. LV systolic function is low normal. EF is around 55%. There is no valvular aortic stenosis.  5. MRI of the brain shows a small punctate area of acute/subacute regions of infarction in the bilateral frontal lobes and posterior parietal region on the right.   DIET: Regular diet    ACTIVITY: PT   FOLLOW-UP:  1. Follow-up with Dr. Wyn Quaker of Vascular Surgery for carotid artery stenosis in September as scheduled.  2. Check PT-INR on 10/11/2011 and adjust Coumadin dose accordingly.   BRIEF SUMMARY OF HOSPITAL COURSE: Erik [name] is a 81 year old Caucasian gentleman with history of CVA in the past with right upper extremity chronic weakness, history of polycythemia, and recent history of DVT and PE on Coumadin who came in with:  1. Acute CVA with left-sided weakness. The patient was admitted on telemetry floor where he remained in sinus rhythm. He was continued on his Coumadin. Neurology consultation was obtained with Dr. Olin Pia. MRI results were noted as above. Acute/subacute infarct appeared to be embolic process. The patient does have right 50 to 75% stenosis of the proximal ICA. Dr. Wyn Quaker saw the patient and recommended outpatient follow-up. PT saw the patient and recommended rehab. The patient will be transferred pending bed availability. Neurologically his left-sided weakness improved. He has chronic right upper extremity weakness from previous stroke.  2. Polycythemia vera. Continue hydroxyurea. Dr. Sherrlyn Hock saw the patient. No indication for phlebotomy.  3. Hypertension. Initially permissive hypertension was allowed in the setting of acute CVA, However,  he has been started on his benazepril.  4. History of carotid artery occlusion on the left status post surgery, now with right ICA stenosis of 50 to 70%. The patient will see Dr. Wyn Quakerew as outpatient. He has a September appointment.  5. History of recent DVT and  pulmonary embolus. INR was subtherapeutic. Coumadin dosing was adjusted by pharmacy. INR was therapeutic to 2.3 prior to discharge.  6. Functional decline due to repeated stroke. The patient is agreeable for rehab on discharge once bed is available.  7. GI prophylaxis with PPI.   Hospital stay otherwise remained stable.   CODE STATUS: The patient remained a FULL CODE.   TIME SPENT: 40 minutes.   ____________________________ Wylie HailSona A. Allena KatzPatel, MD sap:drc D: 10/09/2011 08:52:00 ET T: 10/10/2011 11:02:40 ET JOB#: [id number]  cc: Nasreen Goedecke A. Allena KatzPatel, MD, <Dictator> Steele SizerMark A. Crissman, MD Annice NeedyJason S. Dew, MD Willow OraSONA A Tayler Heiden MD ELECTRONICALLY SIGNED 10/10/2011 15:45

## 2014-05-26 NOTE — Discharge Summary (Signed)
PATIENT NAME:  Erik [name], [name] MR#:  [id number] DATE OF BIRTH:  Aug 31, 1933  DATE OF ADMISSION:  10/04/2011 DATE OF DISCHARGE:  10/10/2011  DATE OF DISCHARGE: 10/10/2011   ADDENDUM:  For a detailed note, please see the History and Physical done on admission by Dr. Elisabeth PigeonVachhani. Please see the detailed discharge summary done by Dr. Enedina FinnerSona Patel which covers the extensive hospital course from August 28 until September 02. This is just an addendum to 10/10/2011.   DISCHARGE DIAGNOSES:  1. Acute CVA with left-sided weakness due to embolic process. MRI of the brain confirming infarctions of left and right frontal and right posterior parietal lobe.  2. History of previous cerebrovascular accident with right upper extremity weakness.  3. History of carotid artery occlusion.  4. Polycythemia vera.  5. Hypertension.  6. History of recent deep vein thrombosis and pulmonary embolism.    DISCHARGE MEDICATIONS: 1. Simvastatin 40 mg daily.  2. Hydroxyurea 500 mg daily.  3. Omeprazole 20 mg daily.  4. Amlodipine 10 mg daily. 5. Benazepril 40 mg daily.  6. Warfarin 4 mg daily.   BRIEF ADDENDUM TO HOSPITAL COURSE:  This is a 81 year old male with medical problems as stated above who presented to the hospital with left-sided weakness and was noted to have an acute cerebrovascular accident confirmed by the MRI. The patient was awaiting short-term rehab placement. The patient and the patient's family have accepted short-term rehab placement to [facility] to where he currently is being discharged. He has had no acute neurologic complications overnight. He remains hemodynamically stable. He is taking p.o. well. He is tolerating and occupational therapy well. He is being discharged on the medications stated above with ongoing PT and OT. Again, for a detailed discharge summary,  please see Dr. Jearl KlinefelterSona Patel's discharge summary,  which covers the extensive hospital course.   TIME SPENT: 35 minutes.     ____________________________ Rolly PancakeVivek J. Cherlynn KaiserSainani, MD vjs:bjt D: 10/10/2011 11:38:50 ET T: 10/10/2011 12:10:37 ET JOB#: [id number]  cc: Rolly PancakeVivek J. Cherlynn KaiserSainani, MD, <Dictator> Houston SirenVIVEK J Deontrae Drinkard MD ELECTRONICALLY SIGNED 10/11/2011 15:57

## 2014-05-26 NOTE — H&P (Signed)
PATIENT NAME:  Erik [name], Erik [name] MR#:  [id number] DATE OF BIRTH:  Dec 18, 1933  DATE OF ADMISSION:  09/18/2011  PRIMARY CARE PHYSICIAN:  Dr. Dossie Arbour REFERRING PHYSICIAN:  Dr. Jens Som   CHIEF COMPLAINT:  Chest pain since last night.  HISTORY OF PRESENT ILLNESS: 81 year old Caucasian male with a history of cerebrovascular accidents twice, in 2005 and 2007, history of transient ischemic attack, polycythemia vera, hypertension, and high cholesterol who presented to the ED with chest pain since last night which is almost on the left side, sharp, intermittent, 9/10, no radiation. The patient denies any fever or chills. No cough, sputum, or shortness of breath.  No hemoptysis. The patient denies any leg swelling or pain. He denies any recent travel history.  He has a history of polycythemia vera and he follows with Dr. Sherrlyn Hock.  Hematocrit has been well controlled.  His last phlebotomy was last September. In the ED CT angio showed bilateral small pulmonary emboli. The patient is being treated with a heparin drip now and is admitted for pulmonary emboli.   PAST MEDICAL HISTORY: 1. Hypertension.  2. Hyperlipidemia.  3. Polycythemia vera. 4. Cerebrovascular accident twice.  5. Transient ischemic attack twice. 6. Bilateral carotid arterial stenosis, status post surgery on the left side.   PAST SURGICAL HISTORY:  1. Left carotid artery stenosis surgery. 2. Cataract surgery recently.   SOCIAL HISTORY: Denies any smoking, alcohol drinking, or illicit drugs.   FAMILY HISTORY: No hypertension, diabetes, stroke, or heart attack.   ALLERGIES: No known drug allergies.   MEDICATIONS:  1. Norvasc 10 mg p.o. daily.  2. Aspirin 325 mg p.o. daily.  3. Benazepril  40 mg p.o. daily.  4. Durezol 0.05% ophthalmic emulsion, one drop into left eye twice a day.  5. Eyedrops,  one drops to each affected eye once a day.  6. Hydroxyurea 500 mg p.o. daily, except Tuesday and Friday.  7. Lovastatin 1 tablet p.o. daily.    REVIEW OF SYSTEMS: CONSTITUTIONAL: The patient denies any fever or chills. No headache or dizziness. No weakness or weight loss. EYES: No double vision or blurred vision.  ENT: No epistaxis, postnasal drip, slurred speech, or dysphagia. CARDIOVASCULAR: Positive for chest pain but no palpitations, orthopnea, or nocturnal dyspnea. No leg edema. PULMONARY: No cough, sputum, shortness of breath, or hemoptysis. GI: No abdominal pain, nausea, vomiting, or diarrhea. No melena or bloody stool. GU: No dysuria, hematuria, or incontinence.  SKIN: No rash or jaundice. HEMATOLOGY: No easy bruising or bleeding. ENDOCRINE: No polyuria, polydipsia, or heat or cold intolerance. NEURO: No syncope, loss of consciousness, or seizure. MUSCULOSKELETAL: No joint pain or neck swelling pain. PSYCH: No depression. No anxiety.   PHYSICAL EXAMINATION: VITAL SIGNS:  Temperature 99.6, blood pressure 135/66, pulse 73, respirations 18, oxygen saturation 96% on room air.   GENERAL: The patient is alert, awake, oriented, in no acute distress.   HEENT: Pupils round, equal, reactive to light and accommodation. Moist oral mucosa. Clear oropharynx.   NECK: Supple. No JVD or carotid bruit. No lymphadenopathy. No thyromegaly.   CARDIOVASCULAR: S1, S2, regular rate and rhythm. No murmurs or gallops.   LUNGS: Bilateral air entry. No wheezing or rales. No use of accessory muscles to breathe.   ABDOMEN: Soft. No distention or tenderness. No organomegaly. Bowel sounds present.   EXTREMITIES: No edema, clubbing, or cyanosis. No calf tenderness. Strong bilateral pedal pulses.   NEUROLOGIC: Alert and oriented times three. No focal deficit. Deep tendon reflexes 2+. Power 5/5. Sensation intact.   SKIN:  No rash or jaundice.   LABORATORY DATA: WBC 9.9, hemoglobin 13.7, platelets 423, hematocrit 39.1, MCV 123. Glucose 116, BUN 18, creatinine 1.4, mildly elevated from previous creatinine. Sodium 142, potassium 4.7, chloride 108, bicarbonate  27. Troponin less than 0.02. CK 44. CK-MB less than 0.5. EKG normal sinus rhythm at 76 beats per minute. CT angio of chest showed bilateral small pulmonary emboli.   IMPRESSION:  1. Bilateral pulmonary emboli.  2. Renal insufficiency.  3. Polycythemia vera.   4. Hypertension.  5. History of cerebrovascular accident.  6. Hyperlipidemia.  7. History of bilateral carotid arterial stenosis.   PLAN OF TREATMENT:  1. The patient's will be admitted to the telemetry floor. We will continue heparin drip. We will get venous duplex of bilateral lower extremities and we will get hematology consult from Dr. Sherrlyn HockPandit and continue his hydroxyurea and follow up CBC.  2. We will continue aspirin, Norvasc, and benazepril for high blood pressure.  3. We will give IV fluid for renal insufficiency and follow up BMP.  4. GI prophylaxis.  5. Discussed the patient's situation with the patient's son, daughter, and other family member.   TIME SPENT:  60 minutes.  ____________________________ Shaune PollackQing Jhonnie Aliano, MD qc:bjt D:  09/18/2011 00:52:23 ET         T: 09/18/2011 08:35:50 ET        JOB#: [id number]  cc:  Steele SizerMark A. Crissman, MD Shaune PollackQING Brayant Dorr MD ELECTRONICALLY SIGNED 09/19/2011 13:26

## 2014-05-26 NOTE — Consult Note (Signed)
PATIENT NAME:  Erik [name], CARSEY MR#:  [id number] DATE OF BIRTH:  Apr 02, 1933  DATE OF CONSULTATION:  10/05/2011  REFERRING PHYSICIAN:  Dr. Elisabeth Pigeon  CONSULTING PHYSICIAN:  Elana Alm. Olin Pia, MD  REASON FOR CONSULTATION: Stroke.  HISTORY OF PRESENT ILLNESS: Erik [name] is a 81 year old white male with a known history of prior left hemispheric stroke as well as atrial fibrillation and hypercoagulable state with polycythemia vera and known pulmonary embolus and DVT. He is undergoing evaluation for a possible stroke. History was according to the patient's brother as well as via the hospital chart.   Erik [name] apparently has had history of at least three strokes in the past that left him with a paralyzed right upper extremity. He apparently lives by himself and is independent, using his left upper extremity to feed and perform other daily activities. As noted above, he has had a history of polycythemia vera as well as pulmonary embolus and DVT and has been placed on Coumadin. He was in his usual state of health until yesterday when he reports that his nephew apparently had brought him lunch and found him on the floor. He did not lose consciousness but there was noted to be some new left-sided weakness. He was taken to [facility] Emergency Room where he was evaluated further and underwent a head CT that showed no acute changes but there was noted to be some atrophy. There were also some white matter changes. His INR on admission was 1.5. Overnight he has done fairly well but still complains that he is not able to use his left arm for basic functional activities. Neurology is consulted at this time for further evaluation and to make recommendations regarding his Coumadin therapy.   A pertinent additional note is that Erik [name] apparently has had some recent issues regulating his Coumadin. His admission note makes reference to the fact that his INR was elevated at 6 last week and some further adjustments to his  Coumadin were made but apparently on the same day he had some feelings of weakness in his left arm and leg. EMS was summoned at that time, however, his symptoms had resolved by the time of their arrival and he elected not to go to the hospital. It is noted that a prior stroke in 2007 left him with some residual difficulties with impaired speech. He had been diagnosed with a DVT and pulmonary embolus earlier this month after which point he was discharged on Coumadin.   PAST MEDICAL HISTORY: 1. Polycythemia vera as noted above.  2. Recently diagnosed pulmonary embolus and deep vein thrombosis. 3. History of stroke x3 in the past, apparently once in 2005 and twice in 2007, left with residual paralysis of the right upper extremity and right lower limb weakness. He will typically drag his right leg.  4. History of atrial fibrillation.  5. Left leg lymphatic swelling for which he uses stockings.   No noted history of diabetes, prior seizure, kidney or lung problems.   PAST SURGICAL HISTORY: 1. Left-sided total carotid occlusion with carotid artery endarterectomy. 2. Left eye cataract surgery.   MEDICATIONS:  1. Protonix. 2. Zocor. 3. Lovenox.  ALLERGIES: No known drug allergies.  SOCIAL HISTORY: He resides locally. He lives alone. 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 Apparently quit smoking many years ago. Does not use alcohol.  FAMILY HISTORY: Reviewed and noncontributory.   REVIEW OF SYSTEMS: Neurological review of systems as noted above.   PHYSICAL EXAMINATION:   GENERAL: Pleasant, cooperative, elderly  white male who is currently in no acute distress. He is alert and appears to be oriented x2. He does not know the year. He has fluent but otherwise dysarthric speech. He is able to repeat and follow commands and other functioning appear to be intact.   VITAL SIGNS: Afebrile. Vital signs are stable. His most recent blood pressure is 114/58.   MOTOR: He appears to have normal bulk and  tone with 5 out of 5 strength on the left side. More specifically, he does appear to generate essentially full power of the left arm on manual testing. The right arm is held in flexed contracted posture and is notably spastic. He also has notable spasticity in the right leg with weakness, probably grade 2 to 3 out of 5.  SENSORY: Symmetric pinprick and vibration.   CEREBELLAR: Normal finger-to-nose with the left arm. There is no evidence of pronator drift of the left arm. Deep tendon reflexes were 1+. Toes appear to be upgoing bilaterally.   CARDIOVASCULAR: Distant heart sounds, otherwise regular rate and rhythm. No evidence of carotid bruits. No evidence of cyanosis, clubbing, or edema.   ABDOMEN: Soft, nontender with normoactive bowel sounds.   LUNGS: Clear to auscultation.  SKIN: No obvious cuts, abrasions, bruises, or rashes.   DIAGNOSTIC STUDIES: Normal lipid panel. Normal CBC. Metabolic panel appears to be unremarkable with an INR of 1.8 today. Diagnostic studies are as noted above.   ASSESSMENT:  1. Possible new right hemispheric stroke versus TIA. 2. History of left hemispheric stroke.  3. Atrial fibrillation, possibly associated with above.  4. History of hypercoagulable state with polycythemia vera and documented pulmonary embolus/DVT.   DISCUSSION: At this time Mr. Erik [name] presents with multiple vascular risk factors outlined above. He's had known prior multiple strokes affecting his right side leaving him with a nonfunctional right upper extremity that is spastic and paretic. He now presents with a new event that apparently has affected his left side. Although he appears to have full strength in the left arm, there is noted to be probably some difficulties with function according to his accounts. This has all occurred in the setting of difficulties in regulating his Coumadin for his hypercoagulable state. The etiology of his presentation is most likely a vascular event given his  hypercoagulable situation as well as what appears to be a history of multifocal strokes consistent with an embolic source.   PLAN: I agree with Coumadin and will target INR of 2 to 3 without any type of heparin bridging. I also agree with head MRA and will also add head MRI. Carotid Doppler's have been ordered. I'm not sure an echocardiogram at this point will add much as he will need Coumadin regardless of the situation. PT/OT/speech therapy evaluations will be ordered. As noted above, he seems to have full strength in the left arm but still complains of reduced functionality. It is unclear whether or not this may be due to some type of apraxia. I will continue his statin as well as vascular risk factor modification. Finally, I would consider a sleep study if there are any risk factors for sleep apnea given the strokes as well as the history of atrial fibrillation.   Thank you very much for allowing me to participate in the care of this very interesting and pleasant patient.   ____________________________ Elana Almobert A. Olin PiaYapundich, MD ray:drc D: 10/05/2011 11:26:00 ET T: 10/05/2011 11:52:04 ET JOB#: [id number]  cc: Molly Maduroobert A. Olin PiaYapundich, MD, <Dictator> Theodosia QuayOBERT A Traver Meckes MD ELECTRONICALLY SIGNED  11/05/2011 18:58 

## 2014-05-26 NOTE — Consult Note (Signed)
PATIENT NAME:  Erik [name], Erik [name] MR#:  [id number] DATE OF BIRTH:  07/09/1933  DATE OF CONSULTATION:  09/18/2011  REFERRING PHYSICIAN:  Dr. Shaune Pollack  CONSULTING PHYSICIAN:  Jasiri Hanawalt R. Sherrlyn Hock, MD  REASON FOR CONSULTATION: PE, polycythemia vera.   HISTORY OF PRESENT ILLNESS: Patient is a 81 year old gentleman with known history of myeloproliferative disorder likely polycythemia vera who has been on hydroxyurea 500 mg daily 5 times a week, also has been on phlebotomy in the past if hematocrit raises to 46 or higher but has not required phlebotomy for a while now. Patient was just seen as outpatient follow up at the [facility] on 08/09 for above issue, blood counts remain under good control and plan was continue monitoring. Patient, however, states that he not acutely sick over the weekend with chest pain and difficulty breathing, was admitted to hospital and was found to have right lower extremity popliteal venous thrombosis along with bilateral pulmonary emboli. Has been started on anticoagulation. States that symptoms are better, currently denies chest pain. Still feels tired. No dyspnea at rest, orthopnea, or paroxysmal nocturnal dyspnea.   PAST MEDICAL HISTORY/SURGICAL HISTORY:  1. Myeloproliferative disorder as above.  2. Hypertension.  3. Hyperlipidemia.  4. Erythrocytosis.  5. History of left MCA stroke in October 2005.  6. History of right clavicular fracture.  7. Remote history of smoking, quit in 1970.   FAMILY HISTORY: Remarkable for hypertension and cerebrovascular accident, negative for hematological disorders.   SOCIAL HISTORY: Ex-smoker, quit in 1970, occasionally chews tobacco. Drinks few beers on and off.   ALLERGIES: No known drug allergies.   HOME MEDICATIONS:  1. Amlodipine 10 mg daily.  2. Aspirin 325 mg daily.  3. Benazepril 40 mg daily.  4. Hydroxyurea 500 mg p.o. daily 5 times a week.  5. Lovastatin 40 mg daily.  6. Durezol 0.05% eye drop left eye twice daily.    REVIEW OF SYSTEMS: CONSTITUTIONAL: As in history of present illness. No fevers or night sweats. HEENT: Denies any headaches, dizziness, epistaxis, ear or jaw pain. CARDIAC: Currently no angina, palpitation, orthopnea, or paroxysmal nocturnal dyspnea. LUNGS: As in history of present illness. Has mild cough. No hemoptysis. GASTROINTESTINAL: No nausea, vomiting, or diarrhea. No blood in stools or melena. GENITOURINARY: No dysuria, hematuria. MUSCULOSKELETAL: No new bone pains or back pains. SKIN: No new rashes or pruritus. HEMATOLOGIC: Denies obvious bleeding symptoms. NEUROLOGIC: History of old cerebrovascular accident with residual right-sided weakness. Denies new focal weakness, seizures, or loss of consciousness. ENDOCRINE: No polyuria or polydipsia. Appetite is fairly steady.   PHYSICAL EXAMINATION:  GENERAL: Patient is resting in bed, alert and oriented in no acute distress. No icterus.   VITAL SIGNS: Temperature 98.1, pulse 64, respiratory rate 15, blood pressure 110/67, 100% on 2 liters.   HEENT: Normocephalic, atraumatic. Extraocular movements intact. Sclera anicteric. Oral exam reveals no thrush. No cervical adenopathy.   CARDIOVASCULAR: S1, S2, regular rate and rhythm.   LUNGS: Lungs show bilateral good air entry, decreased at bases, no rhonchi.   ABDOMEN: Soft, nontender. No hepatosplenomegaly clinically.   EXTREMITIES: Trace edema. No cyanosis.   SKIN: No generalized rashes or major bruising.   NEUROLOGIC: Alert and oriented, cranial nerves seem intact. Residual old right-sided weakness.   MUSCULOSKELETAL: No obvious joint deformity or swelling.   LABORATORY, DIAGNOSTIC, AND RADIOLOGICAL DATA: PTT 82, INR 1.0, WBC 9900, hemoglobin 13.7, platelets 423, creatinine 1.4, potassium 4.7, calcium 9.0.   Venous Doppler lower extremity shows thrombus in popliteal vein on right side demonstrating  areas of partial occlusion and distal complete occlusion. No deep vein thrombosis on the  left side.   CT chest for PE 08/11 shows multiple subsegmental areas of pulmonary embolism.   IMPRESSION AND RECOMMENDATIONS: 81 year old gentleman with prior history of CVA in 2005, myeloproliferative disorder possibly polycythemia vera on low-dose hydroxyurea (along with phlebotomy if hematocrit is greater than 46) admitted with likely unprovoked right lower extremity deep vein thrombosis and bilateral pulmonary embolism. Patient has prior history of stroke which limits his mobility but states that he remains physically active including tries to mow the lawn regularly and has not decreased his level of activity. Blood counts otherwise seem to be under excellent control, no thrombocytosis or erythrocytosis, hematocrit in fact is slightly below normal at 39.1%. Agree with ongoing anticoagulation, he is on IV heparin and starting on Coumadin therapy. Given above history and likely unprovoked thromboembolic phenomena, recommendation would be to pursue long-term anticoagulation unless patient has contraindications like major bleeding issues. Otherwise, continue on current low dose hydroxyurea and monitor blood counts. Patient and family present explained above, they are agreeable to this plan.   Thank you for the referral. Please feel free to contact me if any additional questions.   ____________________________ Maren ReamerSandeep R. Sherrlyn HockPandit, MD srp:cms D: 09/19/2011 01:24:37 ET T: 09/19/2011 06:43:32 ET JOB#: [id number]  cc: Bon Dowis R. Sherrlyn HockPandit, MD, <Dictator>  Wille CelesteSANDEEP R Meko Masterson MD ELECTRONICALLY SIGNED 09/19/2011 11:51

## 2014-05-26 NOTE — H&P (Signed)
PATIENT NAME:  Erik [name], Jakub D MR#:  [id number] DATE OF BIRTH:  [birth year]  DATE OF ADMISSION:  10/04/2011  PRIMARY CARE PHYSICIAN: Dr. Dossie Arbourrissman  HEMATOLOGIST: Dr. Sherrlyn HockPandit  VASCULAR: Dr. Festus BarrenJason Dew   PRESENTING COMPLAINT: Weakness on the left side of the body and left side of face.  HISTORY AND PRESENTATION: This is a 81 year old male with past medical history of three times stroke and deep venous thrombosis and pulmonary embolus recently, polycythemia vera. He was diagnosed having deep venous thrombosis and pulmonary embolus 09/17/2011 and he was discharged with therapeutic dose of Coumadin for that. He has some residual weakness on his right side of body more in upper limb than in lower as a result of his past stroke but he was able to walk and able to manage his day to day activity. Last week he went to his PMD to check his INR for Coumadin. The INR was 6 so PMD suggested to stop Coumadin for three days and then come back for the checkup. Before two days he went to PMD's office and the INR repeated was 2.3 so he started Coumadin at a decreased dose which was decreased from 7 mg to 5 mg daily but on the same day he had feeling of weakness in his left side leg and arm. They called EMS but by the time EMS comes he felt totally fine, same as before so he decided not to come to the hospital. Today in the afternoon his nephew goes to his house and found him on the floor in kitchen having left-sided weakness which is new for him. As per patient this incident happened around 10:00 or 11:00 in the morning. He was brought to the hospital after 2:00 p.m. As per ER physician his INR is high and he was out of window with residual weakness from previous stroke so he was not given TPA. He denies any associated chest pain, headache, hearing or visual changes. He denies any seizures. He denies any loss of consciousness. His left-sided weakness has persisted this time. He is able to move but he still feels weaker on left arm  and left leg compared to what he was feeling in the past. As per daughter present at bedside in the ER he also had weakness on his left side of face and some drooping of his lip on the left side. His speech is already affected from his previous stroke which he had in 2007.   REVIEW OF SYSTEMS: CONSTITUTIONAL: He denies any fever, fatigue or pain. EYES: Denies any blurring of vision, pain or redness. ENT: No tinnitus, ear pain, hearing loss or any discharge from the ear. RESPIRATORY: No short of breath, cough or wheezing. CARDIOVASCULAR: No chest pain, palpitations. GASTROINTESTINAL: Denies any nausea, vomiting, diarrhea, abdominal pain, hematemesis or melena. GENITOURINARY: Denies any hematuria, increased frequency. HEMATOLOGY: Denies any easy bruising or bleeding but he has polycythemia vera. SKIN: No rash or lesions. MUSCULOSKELETAL: No joint swelling or pain. NEUROLOGICAL: He has residual weakness on the right upper limb which he is not able to move. In right lower limb he feels weak but he was able to walk now. Recently he has weakness on his left upper limb and left lower limb. PSYCH: Denies any anxiety, insomnia or bipolar disorder.   PAST MEDICAL HISTORY: 1. Polycythemia vera. He is following with Dr. Sherrlyn HockPandit for that.  2. Stroke, total three times in past; one in 2005 and two times in 2007. He was left with residual weakness of right upper  limb and right lower limb and some speech problem.  3. Cardiac arrhythmia. As per daughter atrial fibrillation was found when he was admitted last time with stroke 2007.  4. He also had left leg lymphatic swelling and he was using stockings for that.  PAST SURGICAL HISTORY: 1. Left-sided total carotid occlusion followed by carotid endarterectomy.  2. Left eye cataract surgery.  SOCIAL HISTORY: Retired. Lives alone with visiting nurse. Able to manage his day to day activity. He was a smoker in the past, quit many years ago. Denies drinking or illegal drug use.    FAMILY HISTORY: No history of cancer in the family.   ALLERGIES: No known drug allergies.   HOME MEDICATIONS: 1. Lovastatin 40 mg daily. 2. Hydroxyurea 500 mg daily. 3. Benazepril 40 mg daily.  4. Amlodipine 10 mg daily.  5. Coumadin. He was discharged from hospital with 7 mg daily but his INR was high so he was decreased to 5 mg daily recently.   PHYSICAL EXAMINATION:  GENERAL APPEARANCE: He is alert, oriented, not in acute distress.   HEAD AND NECK: Conjunctiva pink. Oral mucosa wet. No tenderness or signs of injury.   NECK: No JVD or palpable lymph nodes.   RESPIRATORY: Bilateral clear, equal.   CARDIOVASCULAR: No murmur. Regular rate and rhythm.   ABDOMEN: Nontender. Bowel sounds present.   EXTREMITIES: No edema.  SKIN: No rash.   CNS: Left-sided facial weakness present. He is able to move both eyes to the right side but not able to move to the left side to full extent. Right upper limb 1/5. Right lower limb 3/5. Left upper limb 4/5. Left lower limb 4/5. Sensations intact on the limbs. Follows command.   VITAL SIGNS: Blood pressure 128/72, heart rate 68, temperature 97.6, respirations 18 per minute.   LABORATORY, DIAGNOSTIC AND RADIOLOGICAL DATA: Glucose 99, BUN 11, creatinine 1.17, sodium 140, potassium 3.8, chloride 107, carbon dioxide 25, calcium 8.6, total protein 6.7, albumin 3.4, bilirubin 0.4, alkaline phosphatase 58, SGOT 30, SGPT 23, troponin less than 0.02, WBC 10.9, RBC 3.13, hemoglobin 12.4, hematocrit 36.8, platelet count 387, MCV 118, prothrombin time 18.5, INR 1.5, APTT 33.1. Urinalysis grossly negative. CT head chronic and involutional changes without evidence of acute abnormalities.   ASSESSMENT: 81 year old male with past medical history of polycythemia vera and multiple strokes, recent history of deep venous thrombosis and pulmonary embolus and subtherapeutic INR came with stroke.   PLAN:  1. Cerebrovascular accident, left-sided weakness. This may be  possibly due to his hypercoagulable state secondary to polycythemia vera or thrombosis or embolization as a result of his subtherapeutic INR and polycythemia vera. Might be also possible as a result of atrial fibrillation which is paroxysmal and found during last stroke. INR is subtherapeutic, 1.5. We will give him higher dose today, 10 mg, and follow INR tomorrow. I prefer not to give him IV heparin drip to bridge him to therapeutic INR as history of multiple stroke and with this stroke there is increased risk of intracranial bleeding. Will give him statin, telemetry monitoring, physical therapy consult, neurology consult, carotid Doppler, 2-D echo for heart and MRA of brain.  2. Polycythemia vera. Will continue hydroxyurea, his home medication. Will call hematology consult to decide any change in his therapy as he has recent episodes of hypercoagulability evident by deep venous thrombosis, pulmonary embolus and now thrombotic stroke.  3. Hypertension. Right now we will hold all his blood pressure medication and allow the mean arterial pressure to be up to  110. If it goes higher than that then will start him on antihypertensive medication.  4. History of carotid artery occlusion and status post surgery. We will repeat carotid Doppler study to check the patency of bilateral carotid arteries.  5. Recent deep venous thrombosis and pulmonary embolus on 09/17/2011. Will increase the dose of Coumadin today 10 mg and follow INR to bring it up to therapeutic.  6. Functional decline due to repeated stroke, deep venous thrombosis and pulmonary embolus. Will get physical therapy consult to decide further planning.  7. GI prophylaxis with IV Nexium. Maintain n.p.o. till speech and swallow evaluation is done.  8. Health care proxy is daughter. CODE STATUS: FULL CODE.   Plan discussed with family and agree.   TOTAL TIME SPENT: 55 minutes.   ____________________________ Hope Pigeon Elisabeth Pigeon,  MD vgv:cms D: 10/04/2011 18:34:42 ET T: 10/05/2011 05:48:07 ET JOB#: [id number]  cc: Hope Pigeon. Elisabeth Pigeon, MD, <Dictator> Steele Sizer, MD Altamese Dilling MD ELECTRONICALLY SIGNED 10/29/2011 17:51

## 2014-05-26 NOTE — Op Note (Signed)
PATIENT NAME:  Erik [name], Erik [name] MR#:  [id number] DATE OF BIRTH:  02/18/1933  DATE OF PROCEDURE:  11/03/2011  PREOPERATIVE DIAGNOSES:  1. Acute deep venous thrombosis, right lower extremity.  2. History of pulmonary embolus.  3. Coumadin failure with acute deep venous thrombosis while on anticoagulation.   POSTOPERATIVE DIAGNOSES:  1. Acute deep venous thrombosis, right lower extremity.  2. History of pulmonary embolus.  3. Coumadin failure with acute deep venous thrombosis while on anticoagulation.   PROCEDURES PERFORMED:  1. Inferior venacavogram.  2. Placement of infrarenal inferior vena caval filter, Meridian type.   SURGEON: Renford DillsGregory G. Alonah Lineback, MD  SEDATION: Versed 2 mg plus fentanyl 50 mcg administered IV. Continuous ECG, pulse oximetry and cardiopulmonary monitoring is performed throughout the entire procedure by the interventional radiology nurse. Total sedation time is 30 minutes.   ACCESS: 9 French sheath, left common femoral vein.   CONTRAST USED: Isovue 15 mL.   FLUORO TIME: 0.6 minutes.   INDICATIONS: [name] is a 81 year old gentleman with a history of PE and known deep vein thrombosis presented with increasing pain and swelling of the right lower extremity and an acute deep vein thrombosis was identified in spite of his anticoagulation. He is therefore undergoing placement of an IVC filter to prevent lethal pulmonary embolism. Risks and benefits were reviewed. All questions answered. Patient agrees to proceed.   DESCRIPTION OF PROCEDURE: Patient is taken to special procedures, placed in supine position. After adequate sedation is achieved, left groin is prepped and draped in sterile fashion. Ultrasound is placed in a sterile sleeve. Ultrasound is utilized secondary to lack of appropriate landmarks and to avoid vascular injury. Under direct ultrasound visualization, the femoral vein is identified. It is echolucent and compressible indicating patency. Image is recorded for the  permanent record. Under direct real-time visualization, Seldinger needle is inserted into the vein. J-wire is then advanced under fluoroscopic guidance. Dilator, sheath are then advanced and the sheath is positioned at the confluence of the iliac veins. Bolus injection of contrast is then used to image the inferior vena cava in the AP projection. After review of the image, Meridian filter is advanced to the L3 level below the right renal vein and then deployed without difficulty in excellent orientation. Sheath is pulled, pressure is held and there are no immediate complications.   INTERPRETATION: Inferior vena cava is opacified with a bolus injection of contrast. Both right and left renal veins demonstrate reflux of contrast and are well marked. Vena cava measures approximately 22 mm in diameter. It is free of filling defects.   Meridian filter is deployed in excellent orientation.   ____________________________ Renford DillsGregory G. Lalonnie Shaffer, MD ggs:cms [name]: 11/03/2011 13:26:40 ET T: 11/03/2011 15:04:37 ET JOB#: [id number]  cc: Renford DillsGregory G. Cerissa Zeiger, MD, <Dictator> Steele SizerMark A. Crissman, MD Renford DillsGREGORY G Shaundra Fullam MD ELECTRONICALLY SIGNED 11/07/2011 14:06

## 2014-05-26 NOTE — Op Note (Signed)
PATIENT NAME:  Erik [name], Erik [name] MR#:  [id number] DATE OF BIRTH:  Nov 20, 1933  DATE OF PROCEDURE:  08/28/2011  PREOPERATIVE DIAGNOSIS: Cataract, left eye.   POSTOPERATIVE DIAGNOSIS:  Cataract, left eye.  PROCEDURE PERFORMED: Extracapsular cataract extraction using phacoemulsification with placement of an Alcon [id number], 22-diopter posterior chamber lens, serial # [id number].  SURGEON: Maylon PeppersSteven A. Anisten Tomassi, [name].   ANESTHESIA: 4% lidocaine and 0.75% Marcaine in a 50-50 mixture with 10 units/mL of Hylenex added, given as a peribulbar.   ANESTHESIOLOGIST: Dr. Dimple Caseyice   COMPLICATIONS: None.   ESTIMATED BLOOD LOSS: Less than 1 mL.   DESCRIPTION OF PROCEDURE:  The patient was brought to the operating room and given a peribulbar block.  The patient was then prepped and draped in the usual fashion.  The vertical rectus muscles were imbricated using 5-0 silk sutures.  These sutures were then clamped to the sterile drapes as bridle sutures.  A limbal peritomy was performed extending two clock hours and hemostasis was obtained with cautery.  A partial thickness scleral groove was made at the surgical limbus and dissected anteriorly in a lamellar dissection using an Alcon crescent knife.  The anterior chamber was entered supero-temporally with a Superblade and through the lamellar dissection with a 2.6 mm keratome.  DisCoVisc was used to replace the aqueous and a continuous tear capsulorrhexis was carried out.  Hydrodissection and hydrodelineation were carried out with balanced salt and a 27 gauge canula.  The nucleus was rotated to confirm the effectiveness of the hydrodissection.  Phacoemulsification was carried out using a divide-and-conquer technique.  Total ultrasound time was 1 minute and 40 seconds with an average power of  15.5 percent.  CDE 27.31 percent.  Irrigation/aspiration was used to remove the residual cortex.  DisCoVisc was used to inflate the capsule and the internal incision was enlarged to 3  mm with the crescent knife.  The intraocular lens was folded and inserted into the capsular bag using the Acrysert delivery system. Irrigation/aspiration was used to remove the residual DisCoVisc.  Miostat was injected into the anterior chamber through the paracentesis track to inflate the anterior chamber and induce miosis.  The wound was checked for leaks and none were found. The conjunctiva was closed with cautery and the bridle sutures were removed.  Two drops of 0.3% Vigamox were placed on the eye.   An eye shield was placed on the eye.  The patient was discharged to the recovery room in good condition.   ____________________________ Maylon PeppersSteven A. Ojani Berenson, MD sad:bjt [name]: 08/28/2011 13:20:53 ET T: 08/28/2011 13:51:02 ET JOB#: [id number]  cc: Viviann SpareSteven A. Ansley Stanwood, MD, <Dictator> Erline LevineSTEVEN A Kimble Delaurentis MD ELECTRONICALLY SIGNED 09/11/2011 13:40

## 2014-05-26 NOTE — Discharge Summary (Signed)
PATIENT NAME:  Erik [name], [name] MR#:  [id number] DATE OF BIRTH:  10-Jan-1934  DATE OF ADMISSION:  09/18/2011 DATE OF DISCHARGE:  09/24/2011  ADDENDUM: The original discharge dictation was done yesterday. The patient did not go because her INR was 1.7. This morning INR is 2.1. The patient is going home. The discharge diagnosis is still the same, vitals are stable this morning. I spoke to the patient and he says he feels better. The patient can be discharged home. The patient can follow up with either Dr. Janese BanksSandeep Pandit or primary doctor and check his PT-INR on Tuesday or Wednesday.  TIME SPENT ON DISCHARGE PREPARATION: More than 30 minutes.  ____________________________ Katha HammingSnehalatha Winnie Umali, MD sk:slb D: 09/24/2011 09:51:57 ET T: 09/25/2011 10:45:52 ET JOB#: [id number]  cc: Katha HammingSnehalatha Keona Bilyeu, MD, <Dictator> Katha HammingSNEHALATHA Jerry Clyne MD ELECTRONICALLY SIGNED 10/11/2011 10:19

## 2014-05-26 NOTE — Discharge Summary (Signed)
PATIENT NAME:  Erik [name], Erik [name] MR#:  [id number] DATE OF BIRTH:  [birth year]  DATE OF ADMISSION:  09/18/2011 DATE OF DISCHARGE:  09/23/2011  DISPOSITION: Home with Home Health.   DISCHARGE DIAGNOSES: 1. Bilateral pulmonary emboli.  2. Chest pain secondary to bilateral pulmonary emboli.  3. Right leg deep vein thrombosis.  4. Hypertension.  5. Polycythemia vera.     DISCHARGE MEDICATIONS:  1. Norvasc 10 mg daily.  2. Benazepril 40 mg daily.  3. Hydroxyurea 500 mg daily except Tuesday and Friday.  4. Lovastatin 40 mg daily.  5. Ilevro 0.3% ophthalmic solution, one drop in affected eye daily in the left eye. 6. Durezol eyedrops left eye due to cataract surgery.  7. Coumadin 7 mg daily.  The patient is advised to stop aspirin.   DIET: Low sodium, low fat diet.   CONSULTATIONS:  Dr. Janese BanksSandeep Pandit from oncology.   HOSPITAL COURSE: 81 year old male with history of hypertension, hyperlipidemia, polycythemia vera, history of cerebrovascular accident, and bilateral carotid artery stenosis who came in because of chest pain. See the History and Physical for full details. The patient had no cough, no trouble breathing, no sputum production. He was found to have bilateral pulmonary emboli on the CT scan of the chest and was admitted to Intensive Care Unit.  He was started on heparin and Coumadin. The patient's right leg ultrasound showed blood clot in the leg. The patient has thrombus in the popliteal vein on the right side with partial occlusion and distal complete occlusion. The patient has more or less sedentary lifestyle so we started him on heparin and Coumadin, discussed the risks and benefits of  anticoagulation. INR today is pending but yesterday it was 1.9. Hopefully if it is over 2 we are going to discharge him home with home physical therapy. The patient was also seen by Dr. Janese BanksSandeep Pandit because of his polycythemia vera. He advised that it is like unprovoked deep vein thrombosis.  The patient  also had hypercoagulable work-up which showed protein C, protein S panel within normal range. So he will be on anticoagulation for his lifetime. Discussed this plan with the patient's daughter. Because of his deconditioning he was seen by the physical therapist who recommended Home Health and the same is explained. The patient will be discharged today.  For his polycythemia he was continued on Hydrea. For his hypertension he was continued on his Norvasc and benazepril.  He had cataract surgery recently so he can continue the eyedrops and finish it off.   The patient has a history of cerebrovascular accident in 2005.  The patient gets phlebotomies if hematocrit is greater than 46.  He will follow up with Dr. Janese BanksSandeep Pandit as usual and follow with his primary care physician regarding his PT/INR check-ups on a regular basis and adjust the Coumadin.  If he has any bleeding episodes from his rectum or epistaxis or any other signs of bleeding, he was advised to come to the Emergency Room.  TIME SPENT ON DISCHARGE PREPARATION: More than 30 minutes.    ____________________________ Katha HammingSnehalatha Damika Harmon, MD sk:bjt [name]: 09/23/2011 09:29:44 ET T: 09/23/2011 16:13:31 ET JOB#: [id number]  cc: Katha HammingSnehalatha Braxtin Bamba, MD, <Dictator> Steele SizerMark A. Crissman, MD Katha HammingSNEHALATHA Cristhian Vanhook MD ELECTRONICALLY SIGNED 10/11/2011 10:19

## 2014-05-26 NOTE — Consult Note (Signed)
DATE OF CONSULTATION:  10/05/11 PHYSICIAN:  Dr. Jarvis NewcomerV. VachhaniPHYSICIAN:  Dr. Darden DatesS. Shawnice Tilmon, MD  REASON FOR CONSULTATION: left sided weakness, h/o recurrent thromboemoblic phenomena, known h/o polycythemia vera.  OF PRESENT ILLNESS: Patient is a 81 year old gentleman with known history of myeloproliferative disorder likely polycythemia vera who has been on hydroxyurea, also has been on phlebotomy in the past if hematocrit raises to 46 or higher but has not required phlebotomy for a while now. Patient was recently hospitalised earlier this month with PE and has been on coumadin. Despite this, he is now admitted with feeling of weakness in his left side leg and arm. Last week reportedly INR was 6 and Coumadin dose was being adjusted, and repeat INR was 2.3 so he started Coumadin at a decreased dose. Currently states that weakness is somewhat better. Still feels tired. No dyspnea at rest, orthopnea, or paroxysmal nocturnal dyspnea.  MEDICAL HISTORY/SURGICAL HISTORY:  1. Myeloproliferative disorder as above.  2. Hypertension.  Hyperlipidemia.  Erythrocytosis.  History of left MCA stroke in October 2005.  History of right clavicular fracture.  Remote history of smoking, quit in 1970.  PE Aug 2013.  HISTORY: Remarkable for hypertension and cerebrovascular accident, negative for hematological disorders.  HISTORY: Ex-smoker, quit in 1970, occasionally chews tobacco. Drinks few beers on and off.   ALLERGIES: No known drug allergies.  MEDICATIONS:  Amlodipine 10 mg daily.  Aspirin 325 mg daily. 40 mg daily. 500 mg p.o. daily 5 times a week. 40 mg daily. 0.05% eye drop left eye twice daily.  OF SYSTEMS: CONSTITUTIONAL: As in history of present illness. No fevers or night sweats. HEENT: Denies any headaches, epistaxis, ear or jaw pain. CARDIAC: Currently no angina, palpitation, orthopnea, or paroxysmal nocturnal dyspnea. LUNGS: As in history of present illness. Has mild cough. No hemoptysis. GASTROINTESTINAL: No nausea,  vomiting, or diarrhea. No blood in stools or melena. GENITOURINARY: No dysuria, hematuria. MUSCULOSKELETAL: No new bone pains or back pains. SKIN: No new rashes or pruritus. HEMATOLOGIC: Denies obvious bleeding symptoms. NEUROLOGIC: History of old cerebrovascular accident with residual right-sided weakness. Denies seizures, or loss of consciousness. ENDOCRINE: No polyuria or polydipsia. Appetite is fairly steady.  EXAMINATION: Patient is resting in bed, alert and oriented in no acute distress. No icterus. SIGNS: Temperature 98.2, pulse 56, respiratory rate 16, blood pressure 111/68, 97% on RA.Extraocular movements intact. Sclera anicteric. Oral exam reveals no thrush. No cervical adenopathy.S1S2, regular rate and rhythm. Lungs show bilateral good air entry, decreased at bases, no rhonchi. Soft, nontender. No hepatosplenomegaly Trace edema. No cyanosis. No generalized rashes or major bruising.Alert and oriented, cranial nerves seem intact. Residual old right-sided weakness. Left sided motor strength about 3-4/5.No obvious joint deformity or swelling.  DIAGNOSTIC, AND RADIOLOGICAL DATA:   INR 1.8, WBC 6300, hemoglobin 11.5, platelets 340, creatinine 1.08, calcium 8.5.  AND RECOMMENDATIONS: 81 year old gentleman with prior history of CVA in 2005, DVT/PE in Aug 2013, myeloproliferative disorder possibly polycythemia vera on low-dose hydroxyurea (along with phlebotomy if hematocrit is greater than 46) admitted with new-onset left sided weakness s/o another episode of CVA. Agree with ongoing evaluation with MRI brain, 2DECHO to look for embolic source. CBCs shows that blood counts have remained under excellent control and therefore it is unlikely that his myeloproliferative disorder is a risk factor at this time. Agree with ongoing anticoagulation, continue on current low dose hydroxyurea and monitor blood counts. Recommend pursuing phlebotomy if Hct rises to > 46. Patient and family present explained above, they are  agreeable to this plan.  Will continue to follow. you for the referral. Please feel free to contact me if any additional questions.  Electronic Signatures: Izola Price (MD)  (Signed on 29-Aug-13 23:18)  Authored  Last Updated: 29-Aug-13 23:18 by Izola Price (MD)

## 2014-05-26 NOTE — Consult Note (Signed)
Patient known to me previously for carotid disease.  Was admitted with neuro changes, stroke symptoms.  US suggests moderate right ICA stenosis and left ICA is known to be occluded.  He is scheduled to see me in office next week, and he can keep that appointment.  No other in hospital studies or tests need to be done.  Electronic Signatures: Annice Needyew, Jason S (MD)  (Signed on 29-Aug-13 17:55)  Authored  Last Updated: 29-Aug-13 17:55 by Annice Needyew, Jason S (MD)

## 2014-05-30 NOTE — Discharge Summary (Signed)
PATIENT NAME:  Erik CollumYNE, Erik [name] MR#:  [id number] DATE OF BIRTH:  August 11, 1933  DATE OF ADMISSION:  06/01/2013 DATE OF DISCHARGE:  06/04/2013  PRIMARY CARE PHYSICIAN:  Steele SizerMark A. Crissman, MD  DISCHARGE DIAGNOSES: 1.  Urinary tract infection with acute encephalopathy with leukocytosis.  2.  History of cerebrovascular accident, dementia, hypertension,   CONDITION: Stable.   CODE STATUS: Full code.   HOME MEDICATIONS: Please refer to the medication reconciliation list.   DIET: Low-sodium, low-fat, low-cholesterol diet.   ACTIVITY: As tolerated.   FOLLOWUP CARE: Follow up with PCP within 1 to 2 weeks. The patient needs fall precautions.    REASON FOR ADMISSION: Weakness, unable to walk and confusion.   HISTORY OF PRESENT ILLNESS:  The patient is a 81 year old Caucasian male with a history of cerebrovascular accident with right-sided weakness, polycythemia vera, deep venous thrombosis with IVC future on Xarelto, was sent to ED from home due to weakness and confusion. For detailed history and physical examination, please refer to the admission note dictated by Dr. Elpidio AnisSudini. In the ED, the patient was noticed to have a urinary tract infection, was treated with antibiotics.   LABORATORY, DIAGNOSTIC AND RADIOLOGICAL DATA:  Showed: Urinalysis revealed 2+ leukocytes esterase with 138 RBC and 44 WBC. The patient's glucose 120, BUN 11, creatinine 1.08, electrolytes were normal. Troponin was less than 0.02. WBC 13.5, hemoglobin 14.3. EKG showed normal sinus rhythm. Chest x-ray no acute findings.  HOSPITAL COURSE: 1.  Urinary tract infection with acute encephalopathy with leukocytosis. After admission, the patient has been treated with Rocephin. Urine culture shows Klebsiella pneumoniae sensitive to Cipro. Blood culture is negative. The patient mental status improved to his baseline. WBC is nomral.  We will change to Cipro p.o. after discharge.  2.  History of cerebrovascular accident.. Continue aspirin and  Xarelto.   3.  History of deep venous thrombosis. The patient is on Xarelto.  4.  Dementia, stable.  5.  The patient underwent physical therapy. The patient has no complaints. Vital signs are stable. He is clinically stable and will be discharged to a skilled nursing facility today. I discussed the patient's discharge plan with the patient, nurse and social worker.   TIME SPENT: About 35 minutes.   ____________________________ Shaune PollackQing Kalin Amrhein, MD qc:cs [name]: 06/04/2013 14:06:15 ET T: 06/04/2013 14:17:56 ET JOB#: [id number]  cc: Shaune PollackQing Lesleyann Fichter, MD, <Dictator> Shaune PollackQING Tawsha Terrero MD ELECTRONICALLY SIGNED 06/04/2013 15:24

## 2014-05-30 NOTE — H&P (Signed)
PATIENT NAME:  Erik [name], Kingslee D MR#:  [id number] DATE OF BIRTH:  1933-07-03  DATE OF ADMISSION:  06/01/2013  PRIMARY CARE PHYSICIAN: Dr. Dossie Arbourrissman.   CHIEF COMPLAINT: Weakness, unable to walk, and confusion.   HISTORY OF PRESENTING ILLNESS: A 81 year old male patient with history of prior CVA with right-sided weakness, polycythemia vera, deep vein thrombosis with IVC filter on Xarelto who presents to the Emergency Room, brought in by family with complaints of unable to walk and weakness. The patient has been different since yesterday having polyuria, wanting to urinate frequently but unable to urinate. The patient also has not eaten anything since yesterday night, although has been drinking fluids. Today, patient went to the bathroom, was unable to return. Later, family tried to help him to stand up but patient was unable to walk and was brought to the Emergency Room here.   Here in the ER, patient is afebrile. Does have leukocytosis and a UTI and along with some confusion, extreme weakness with inability to walk, is being admitted to the hospitalist service.   PAST MEDICAL HISTORY:  1. Deep vein thrombosis.  2. IVC filter.  3. CVA x 4.  4. Polycythemia vera.  5. Hypertension.  6. Mild dementia.   ALLERGIES: No known drug allergies.   SOCIAL HISTORY: The patient does not smoke. No alcohol. No illicit drugs. Lives at home with his family. Ambulates with a walker.   REVIEW OF SYSTEMS:  Unobtainable secondary to patient's mild encephalopathy dementia.   HOME MEDICATIONS:  1. Hydroxyurea 500 mg oral once a day.  2. Lovastatin 40 mg daily.  3. Multivitamin 1 tablet daily.  4. Xarelto 20 mg oral once a day.   FAMILY HISTORY: No history of cancer in the family.   PHYSICAL EXAMINATION:  VITAL SIGNS: Temperature 98.2, pulse 72, blood pressure 117/58, saturating 96% on room air.  GENERAL: Moderately built Caucasian male patient lying in bed, awake, alert, oriented to person but not to place or  time.  PSYCHIATRIC:  Is pleasant.  HEENT: Normocephalic.  Mucosa dry and pink. No oral ulcers or thrush.  No pallor. No icterus. Pupils bilaterally equal and reactive to light.  NECK: Supple. No thyromegaly or palpable lymph nodes. Trachea midline. No carotid bruit or JVD.  CARDIOVASCULAR: S1, S2, without any murmurs. Peripheral pulses 2+. No edema.  RESPIRATORY: Normal work of breathing. Clear to auscultation on both sides.  GASTROINTESTINAL: Soft abdomen, nontender. Bowel sounds present. No hepatosplenomegaly. GENITOURINARY: No CVA tenderness or bladder distention.  SKIN: Warm and dry. No petechiae, rash, ulcers.  MUSCULOSKELETAL: No joint swelling, redness of the large joints. Increased muscle tone in right upper extremity.  NEUROLOGICAL: Motor strength 0/5 in right upper extremity but 5/5 in other extremities. Sensation to fine touch  is intact all over.   LABORATORY STUDIES: Show glucose 120, BUN 11, creatinine 1.08, sodium 142, potassium 3.7, with AST, ALT, alkaline phosphatase, bilirubin normal. Troponin less than 0.02.   WBC 13.5, platelets of 382,000, hemoglobin 14.3.   Urinalysis shows 2+ leukocyte esterase with 138 RBC and 44 WBC. No bacteria seen.  RADIOLOGICAL DATA:  CT scan of the head without contrast shows multiple areas of encephalomalacia and left frontal parietal lobes. Unchanged appearance from prior CT scan.   Chest, PA and lateral, shows no acute findings.   EKG shows normal sinus rhythm. No acute ST-T wave changes.   ASSESSMENT AND PLAN:  1. A urinary tract infection with acute encephalopathy with leukocytosis. We will start the patient on ceftriaxone. Get  urine and blood cultures. The patient will be started on IV antibiotics, fall precautions. The patient does have encephalomalacia on CT on multiple areas and seems to have early-onset dementia. Family states that he does not have any dementia, but the patient is not oriented to time at baseline and likely has  vascular dementia with his history of multiple strokes. Monitor closely. We will consult physical therapy and case management. He may end up needing home health.  2. History of cerebrovascular accident. Continue medications. CT scan looks unchanged.  3. Hypertension. The patient was recently taken off his blood pressure medications as blood pressure was in the normal range. Monitor closely and restart medication if needed.  4. History of deep vein thrombosis with IVC filter.  Patient is currently on Xarelto which will be continued.    CODE STATUS: Full code.   TIME SPENT TODAY ON THIS CASE:  45 minutes.   ____________________________ Molinda Bailiff Kollins Fenter, MD srs:dd D: 06/01/2013 18:23:56 ET T: 06/01/2013 20:23:54 ET JOB#: [id number]  cc: Wardell Heath R. Elpidio Anis, MD, <Dictator> Steele Sizer, MD Orie Fisherman MD ELECTRONICALLY SIGNED 06/02/2013 17:07

## 2014-06-07 DEATH — deceased
